# Patient Record
Sex: Male | Born: 1955 | Race: White | Hispanic: No | Marital: Married | Smoking: Never smoker
Health system: Southern US, Community
[De-identification: ages and names within clinical notes are randomized; demographics above are authoritative.]

## PROBLEM LIST (undated history)

## (undated) DIAGNOSIS — I7771 Dissection of carotid artery: Secondary | ICD-10-CM

## (undated) DIAGNOSIS — R251 Tremor, unspecified: Secondary | ICD-10-CM

## (undated) DIAGNOSIS — K635 Polyp of colon: Secondary | ICD-10-CM

## (undated) DIAGNOSIS — E785 Hyperlipidemia, unspecified: Principal | ICD-10-CM

## (undated) DIAGNOSIS — G4700 Insomnia, unspecified: Secondary | ICD-10-CM

## (undated) HISTORY — DX: Dissection of carotid artery: I77.71

## (undated) HISTORY — DX: Polyp of colon: K63.5

## (undated) HISTORY — DX: Tremor, unspecified: R25.1

## (undated) HISTORY — PX: HERNIA REPAIR: SHX51

## (undated) HISTORY — DX: Insomnia, unspecified: G47.00

## (undated) HISTORY — PX: COLONOSCOPY: SHX174

## (undated) HISTORY — DX: Hyperlipidemia, unspecified: E78.5

---

## 2000-11-26 ENCOUNTER — Encounter (INDEPENDENT_AMBULATORY_CARE_PROVIDER_SITE_OTHER): Payer: Self-pay | Admitting: Specialist

## 2000-11-26 ENCOUNTER — Other Ambulatory Visit: Admission: RE | Admit: 2000-11-26 | Discharge: 2000-11-26 | Payer: Self-pay | Admitting: Gastroenterology

## 2008-04-22 ENCOUNTER — Ambulatory Visit (HOSPITAL_BASED_OUTPATIENT_CLINIC_OR_DEPARTMENT_OTHER): Admission: RE | Admit: 2008-04-22 | Discharge: 2008-04-22 | Payer: Self-pay | Admitting: Family Medicine

## 2008-11-15 ENCOUNTER — Encounter (INDEPENDENT_AMBULATORY_CARE_PROVIDER_SITE_OTHER): Payer: Self-pay | Admitting: *Deleted

## 2009-08-17 ENCOUNTER — Telehealth: Payer: Self-pay | Admitting: Gastroenterology

## 2010-08-12 ENCOUNTER — Encounter: Payer: Self-pay | Admitting: Family Medicine

## 2010-08-21 NOTE — Progress Notes (Signed)
Summary: Schedule Colonoscopy  Phone Note Outgoing Call Call back at [facility] Phone [phone]   Call placed by: Harlow Mares CMA Duncan Dull),  August 17, 2009 11:12 AM Call placed to: Patient Summary of Call: patients home number is disconnected. the alt contact is the wrong number.  Initial call taken by: Harlow Mares CMA (AAMA),  August 17, 2009 11:14 AM

## 2012-02-04 ENCOUNTER — Encounter: Payer: Self-pay | Admitting: Gastroenterology

## 2012-08-31 ENCOUNTER — Ambulatory Visit
Admission: RE | Admit: 2012-08-31 | Discharge: 2012-08-31 | Disposition: A | Discharge status: Home / Self Care | Payer: No Typology Code available for payment source | Source: Ambulatory Visit | Attending: Otolaryngology | Admitting: Otolaryngology

## 2012-08-31 ENCOUNTER — Other Ambulatory Visit: Payer: Self-pay | Admitting: Otolaryngology

## 2012-08-31 DIAGNOSIS — J019 Acute sinusitis, unspecified: Secondary | ICD-10-CM

## 2012-10-09 ENCOUNTER — Other Ambulatory Visit: Payer: Self-pay | Admitting: Family Medicine

## 2012-10-09 DIAGNOSIS — G44009 Cluster headache syndrome, unspecified, not intractable: Secondary | ICD-10-CM

## 2012-10-12 ENCOUNTER — Ambulatory Visit
Admission: RE | Admit: 2012-10-12 | Discharge: 2012-10-12 | Disposition: A | Discharge status: Home / Self Care | Payer: No Typology Code available for payment source | Source: Ambulatory Visit | Attending: Family Medicine | Admitting: Family Medicine

## 2012-10-12 DIAGNOSIS — G44009 Cluster headache syndrome, unspecified, not intractable: Secondary | ICD-10-CM

## 2012-10-23 ENCOUNTER — Encounter: Payer: Self-pay | Admitting: Neurology

## 2012-10-23 ENCOUNTER — Ambulatory Visit (INDEPENDENT_AMBULATORY_CARE_PROVIDER_SITE_OTHER): Payer: Self-pay | Admitting: Neurology

## 2012-10-23 VITALS — BP 125/79 | HR 73 | Ht 67.0 in | Wt 166.0 lb

## 2012-10-23 DIAGNOSIS — I7771 Dissection of carotid artery: Secondary | ICD-10-CM

## 2012-10-23 DIAGNOSIS — G4700 Insomnia, unspecified: Secondary | ICD-10-CM | POA: Insufficient documentation

## 2012-10-23 DIAGNOSIS — E785 Hyperlipidemia, unspecified: Secondary | ICD-10-CM | POA: Insufficient documentation

## 2012-10-23 HISTORY — DX: Dissection of carotid artery: I77.71

## 2012-10-23 MED ORDER — SUMATRIPTAN SUCCINATE 100 MG PO TABS: 100.0000 mg | ORAL_TABLET | ORAL | Status: DC | PRN

## 2012-10-23 MED ORDER — ASPIRIN EC 325 MG PO TBEC: 325.0000 mg | DELAYED_RELEASE_TABLET | Freq: Every day | ORAL | Status: DC

## 2012-10-23 MED ORDER — RIBOFLAVIN 100 MG PO TABS: 100.0000 mg | ORAL_TABLET | Freq: Two times a day (BID) | ORAL | Status: DC

## 2012-10-23 MED ORDER — ASPIRIN 81 MG PO TBEC: 325.0000 mg | DELAYED_RELEASE_TABLET | Freq: Every day | ORAL | Status: DC

## 2012-10-23 MED ORDER — OXYCODONE-ACETAMINOPHEN 10-325 MG PO TABS: 1.0000 | ORAL_TABLET | Freq: Three times a day (TID) | ORAL | Status: DC | PRN

## 2012-10-23 MED ORDER — MAGNESIUM OXIDE 400 MG PO TABS: 400.0000 mg | ORAL_TABLET | Freq: Two times a day (BID) | ORAL | Status: DC

## 2012-10-23 NOTE — Progress Notes (Signed)
[name] is a 57 years old right-handed Caucasian male, referred by his primary care physician Dr. 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  He was previously healthy, at the end of January 2014, he noticed nasal congestion, facial pressure, he presented to primary care for evaluation of possible sinus infection, had a CAT scan of the sinus, only showed left maxillary sinuses, he was getting antibiotic treatment, shortly afterwards, he developed violent vomiting, lasting for 24 hours, after that, he began to have worsening headaches, centered around his left eye, retro-orbital region constant severe pressure pain, eventually referred for MRA of the brain, I have reviewed the films together with his patients and his wife, George Perry at [facility], MR I. 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 There is true lumen narrowing of up to 60% in the visible upper neck, but symmetric appearing flow signal in the distal ICA and carotid termini.  Over the past few months, he continued to have almost constant left upper and retro-orbital area headaches, he was getting verapam as preventive medication, no significant side effect, no significant improvement either, sumatriptan as needed aborted his headache very effectively, but his headache coming back within 24 hours, he also has tried tapering dose of prednisone since end of March 2014, no significant side effect or benefit,  He noticed a left droopy eyelid, increased tearing from his left eye, no dysarthria, no visual difficulty, no motor deficit   Review of Systems  Out of a complete 14 system review, the patient complains of only the following symptoms, and all other reviewed systems are negative.   Constitutional:   N/A Cardiovascular:   N/A Ear/Nose/Throat:  N/A Skin: N/A Eyes: Left retro-orbital eye pain Respiratory: N/A Gastroitestinal: N/A    Hematology/Lymphatic:  N/A Endocrine:  N/A Musculoskeletal:N/A Allergy/Immunology: N/A Neurological: N/A Psychiatric:    N/A  PHYSICAL EXAMINATOINS:  Generalized: In no acute distress  Neck: Supple, no carotid bruits   Cardiac: Regular rate rhythm  Pulmonary: Clear to auscultation bilaterally  Musculoskeletal: No deformity  Neurological examination  Mentation: Alert oriented to time, place, history taking, and causual conversation  Cranial nerve II-XII: Left pupil was smaller than right side, difference was more pronounced in bright light, pupils were reactive, he has left ptosis, cover the upper edge of left pupil.  Extraocular movements were full, visual field were full on confrontational test. facial sensation and strength were normal. hearing was intact to finger rubbing bilaterally. Uvula tongue midline.  head turning and shoulder shrug and were normal and symmetric.Tongue protrusion into cheek strength was normal.  Motor: normal tone, bulk and strength.  Sensory: Intact to fine touch, pinprick, preserved vibratory sensation, and proprioception at toes.  Coordination: Normal finger to nose, heel-to-shin bilaterally there was no truncal ataxia  Gait: Rising up from seated position without assistance, normal stance, without trunk ataxia, moderate stride, good arm swing, smooth turning, able to perform tiptoe, and heel walking without difficulty.   Romberg signs: Negative  Deep tendon reflexes: Brachioradialis 2/2, biceps 2/2, triceps 2/2, patellar 2/2, Achilles 2/2, plantar responses were flexor bilaterally.   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  1. Continue Plavix 75 mg every day, add  on aspirin 81  mg a day, overlapping for 3 months 2. Will repeat MRA neck, and MRA of brain at that time, if there was no evidence of narrowing, I will keep him on Plavix 75 mg only. 3.  For his headaches, it is related to his left internal carotid artery dissection, responding well to Imitrex as needed, limited use to 2-3 times each week, I also giving him Percocet as needed, magnesium oxide, riboflavin twice a day as preventative medication 4. continue verapmil as preventative medication 5. RTC in one month, lab result from pcp, need b12, homocystine level

## 2012-11-23 ENCOUNTER — Encounter: Payer: Self-pay | Admitting: Neurology

## 2012-11-23 ENCOUNTER — Ambulatory Visit (INDEPENDENT_AMBULATORY_CARE_PROVIDER_SITE_OTHER): Payer: Self-pay | Admitting: Neurology

## 2012-11-23 VITALS — BP 141/85 | HR 83 | Ht 67.5 in | Wt 174.0 lb

## 2012-11-23 DIAGNOSIS — I7771 Dissection of carotid artery: Secondary | ICD-10-CM

## 2012-11-23 NOTE — Progress Notes (Signed)
George Perry is a 57 years old right-handed Caucasian male, referred by his primary care physician Dr. 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  He was previously healthy, at the end of January 2014, he noticed nasal congestion, facial pressure, he presented to primary care for evaluation of possible sinus infection, had a CAT scan of the sinus, only showed left maxillary sinuses, he was getting antibiotic treatment, shortly afterwards, he developed violent vomiting, lasting for 24 hours, after that, he began to have worsening headaches, centered around his left eye, retro-orbital region constant severe pressure pain, eventually referred for MRA of the brain, I have reviewed the films together with his patients and his wife, George Perry at [facility], MR I. 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 There is true lumen narrowing of up to 60% in the visible upper neck, but symmetric appearing flow signal in the distal ICA and carotid termini.  Over the past few months, he continued to have almost constant left upper and retro-orbital area headaches, he was getting verapam as preventive medication, no significant side effect, no significant improvement either, sumatriptan as needed aborted his headache very effectively, but his headache coming back within 24 hours, he also has tried tapering dose of prednisone since end of March 2014, no significant side effect or benefit,  He noticed a left droopy eyelid, increased tearing from his left eye, no dysarthria, no visual difficulty, no motor deficit  UPDATE May 5th 2014  He is doing very well clinically, his headache has much improved, his headache centered around his left retro-orbital region, Imitrex has been very helpful, he denies focal signs,    Review of Systems  Out of a complete 14 system review, the patient complains of only the following symptoms, and all other reviewed systems are negative.   Constitutional:   N/A Cardiovascular:  N/A Ear/Nose/Throat:  N/A Skin: N/A Eyes: Left retro-orbital eye pain Respiratory: N/A Gastroitestinal: N/A    Hematology/Lymphatic:  N/A Endocrine:  N/A Musculoskeletal:N/A Allergy/Immunology: N/A Neurological: N/A Psychiatric:    N/A  PHYSICAL EXAMINATOINS:  Generalized: In no acute distress  Neck: Supple, no carotid bruits   Cardiac: Regular rate rhythm  Pulmonary: Clear to auscultation bilaterally  Musculoskeletal: No deformity  Neurological examination  Mentation: Alert oriented to time, place, history taking, and causual conversation  Cranial nerve II-XII: Left pupil was 1 mm smaller than right side, difference was more pronounced in bright light, pupils were reactive, he has left ptosis, cover the upper edge of left pupil.  Extraocular movements were full, visual field were full on confrontational test. facial sensation and strength were normal. hearing was intact to finger rubbing bilaterally. Uvula tongue midline.  head turning and shoulder shrug and were normal and symmetric.Tongue protrusion into cheek strength was normal.  Motor: normal tone, bulk and strength.  Sensory: Intact to fine touch, pinprick, preserved vibratory sensation, and proprioception at toes.  Coordination: Normal finger to nose, heel-to-shin bilaterally there was no truncal ataxia  Gait: Rising up from seated position without assistance, normal stance, without trunk ataxia, moderate stride, good arm swing, smooth turning, able to perform tiptoe, and heel walking without difficulty.   Romberg signs: Negative  Deep tendon reflexes: Brachioradialis 2/2, biceps 2/2, triceps 2/2, patellar 2/2, Achilles 2/2, plantar responses were flexor bilaterally.   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  1. Continue Plavix 75 mg every day, add on aspirin 81 mg a day, overlapping for 3 months 2. Repeat MRA neck, and MRA of brain  In June 2014. 3. Add on folic acid, will also get lab result from his ENT, check b12, homocystein level.

## 2012-12-15 ENCOUNTER — Telehealth: Payer: Self-pay | Admitting: *Deleted

## 2012-12-23 ENCOUNTER — Other Ambulatory Visit: Payer: No Typology Code available for payment source

## 2013-01-01 ENCOUNTER — Ambulatory Visit
Admission: RE | Admit: 2013-01-01 | Discharge: 2013-01-01 | Disposition: A | Discharge status: Home / Self Care | Payer: No Typology Code available for payment source | Source: Ambulatory Visit | Attending: Neurology | Admitting: Neurology

## 2013-01-01 DIAGNOSIS — I7771 Dissection of carotid artery: Secondary | ICD-10-CM

## 2013-01-01 MED ORDER — GADOBENATE DIMEGLUMINE 529 MG/ML IV SOLN
15.0000 mL | Freq: Once | INTRAVENOUS | Status: AC | PRN
  Administered 2013-01-01: 15 mL via INTRAVENOUS

## 2013-01-06 ENCOUNTER — Telehealth: Payer: Self-pay | Admitting: Neurology

## 2013-01-06 NOTE — Telephone Encounter (Signed)
error 

## 2013-01-06 NOTE — Telephone Encounter (Signed)
Please call him MRA showed Slight asymmetry of the proximal internal carotid arteries at the skull base (diameters: right 5mm, left 4mm). Normal distal flow signal bilaterally. This may represent sequelae from prior known left ICA dissection. 2. Compared to prior MRA from 10/12/12, the left ICA dissection has improved. The previously visualized false lumen and narrowed true lumen have significantly improved  Give him a follow up appt in3-4 weeks.

## 2013-01-15 NOTE — Telephone Encounter (Signed)
Left message that MRA showed improvement of ICA dissection, per Dr. Terrace Arabia.  Further questions will be addressed on follow up.

## 2013-01-28 ENCOUNTER — Telehealth: Payer: Self-pay

## 2013-01-28 NOTE — Telephone Encounter (Signed)
Called and spoke with patient trying to schedule a follow up with Dr.Yan. Patient did not want to schedule at this time he will call back if he needs futher care.

## 2013-07-09 ENCOUNTER — Other Ambulatory Visit: Payer: Self-pay | Admitting: Neurology

## 2013-07-09 DIAGNOSIS — I7771 Dissection of carotid artery: Secondary | ICD-10-CM

## 2014-08-05 ENCOUNTER — Other Ambulatory Visit: Payer: Self-pay | Admitting: Neurology

## 2014-08-05 DIAGNOSIS — I7771 Dissection of carotid artery: Secondary | ICD-10-CM

## 2014-08-31 ENCOUNTER — Ambulatory Visit
Admission: RE | Admit: 2014-08-31 | Discharge: 2014-08-31 | Disposition: A | Discharge status: Home / Self Care | Payer: 59 | Source: Ambulatory Visit | Attending: Neurology | Admitting: Neurology

## 2014-08-31 ENCOUNTER — Ambulatory Visit
Admission: RE | Admit: 2014-08-31 | Discharge: 2014-08-31 | Disposition: A | Discharge status: Home / Self Care | Payer: Self-pay | Source: Ambulatory Visit | Attending: Neurology | Admitting: Neurology

## 2014-08-31 DIAGNOSIS — I7771 Dissection of carotid artery: Secondary | ICD-10-CM

## 2014-08-31 MED ORDER — GADOBENATE DIMEGLUMINE 529 MG/ML IV SOLN
15.0000 mL | Freq: Once | INTRAVENOUS | Status: AC | PRN
  Administered 2014-08-31: 15 mL via INTRAVENOUS

## 2014-09-09 ENCOUNTER — Ambulatory Visit (INDEPENDENT_AMBULATORY_CARE_PROVIDER_SITE_OTHER): Payer: Self-pay | Admitting: Surgery

## 2014-09-09 NOTE — H&P (Signed)
History of Present Illness George Perry. George Mcguffee MD; 09/09/2014 12:41 PM) Patient words: hernia.  The patient is a 59 year old male who presents with an inguinal hernia. Referred by Dr. Briscoe Deutscher for evaluation of bilateral inguinal hernias  This is a healthy 59 yo male who presents with 1-2 years of bilateral inguinal bulges. These have not enlarged significantly and remain reducible. No significant tenderness. No obstructive symptoms. The patient's wife has encouraged him to seek surgical attention for these. Dr. Maceo Pro has diagnosed him with inguinal hernia.  He has a history of spontaneous carotid dissection, but has been asymptomatic and off blood thinners for a couple of years. Past Surgical History (Ammie Eversole, LPN; 3/78/5885 02:77 AM) No pertinent past surgical history  Diagnostic Studies History (Ammie Eversole, LPN; 10/31/8784 76:72 AM) Colonoscopy 1-5 years ago  Allergies (Ammie Eversole, LPN; 0/94/7096 28:36 AM) Penicillins  Medication History (Ammie Eversole, LPN; 01/18/4764 46:50 AM) Aspirin EC (81MG  Tablet DR, Oral) Active. Folic Acid (1MG  Tablet, Oral) Active. Magnesium Oxide (400MG  Tablet, Oral) Active. Riboflavin (100MG  Capsule, Oral) Active.  Social History (Ammie Eversole, LPN; 3/54/6568 12:75 AM) Alcohol use Occasional alcohol use. Caffeine use Tea. No drug use Tobacco use Never smoker.  Family History (Ammie Eversole, LPN; 1/70/0174 94:49 AM) First Degree Relatives No pertinent family history     Review of Systems (Ammie Eversole LPN; 6/75/9163 84:66 AM) General Not Present- Appetite Loss, Chills, Fatigue, Fever, Night Sweats, Weight Gain and Weight Loss. Skin Not Present- Change in Wart/Mole, Dryness, Hives, Jaundice, New Lesions, Non-Healing Wounds, Rash and Ulcer. HEENT Not Present- Earache, Hearing Loss, Hoarseness, Nose Bleed, Oral Ulcers, Ringing in the Ears, Seasonal Allergies, Sinus Pain, Sore Throat, Visual Disturbances, Wears  glasses/contact lenses and Yellow Eyes. Respiratory Not Present- Bloody sputum, Chronic Cough, Difficulty Breathing, Snoring and Wheezing. Breast Not Present- Breast Mass, Breast Pain, Nipple Discharge and Skin Changes. Cardiovascular Not Present- Chest Pain, Difficulty Breathing Lying Down, Leg Cramps, Palpitations, Rapid Heart Rate, Shortness of Breath and Swelling of Extremities. Gastrointestinal Not Present- Abdominal Pain, Bloating, Bloody Stool, Change in Bowel Habits, Chronic diarrhea, Constipation, Difficulty Swallowing, Excessive gas, Gets full quickly at meals, Hemorrhoids, Indigestion, Nausea, Rectal Pain and Vomiting.  Vitals (Ammie Eversole LPN; 5/99/3570 17:79 AM) 09/09/2014 10:34 AM Weight: 167.4 lb Height: 69in Body Surface Area: 1.92 m Body Mass Index: 24.72 kg/m Temp.: 98.93F(Oral)  Pulse: 86 (Regular)  BP: 122/88 (Sitting, Left Arm, Standard)     Physical Exam Rodman Key K. Mayur Duman MD; 09/09/2014 12:41 PM)  The physical exam findings are as follows: Note:WDWN in NAD HEENT: EOMI, sclera anicteric Neck: No masses, no thyromegaly Lungs: CTA bilaterally; normal respiratory effort CV: Regular rate and rhythm; no murmurs Abd: +bowel sounds, soft, non-tender, no masses GU: bilateral inguinal bulges - reducible; no testicular masses Ext: Well-perfused; no edema Skin: Warm, dry; no sign of jaundice    Assessment & Plan Rodman Key K. Joselinne Lawal MD; 09/09/2014 12:42 PM)  BILATERAL INGUINAL HERNIA WITHOUT OBSTRUCTION OR GANGRENE, RECURRENCE NOT SPECIFIED (550.92  K40.20)  Current Plans Schedule for Surgery - Bilateral inguinal hernia repairs with mesh. Please call us back to let us know if you prefer the open approach or the laparoscopic approach.  The surgical procedure has been discussed with the patient. Potential risks, benefits, alternative treatments, and expected outcomes have been explained. All of the patient's questions at this time have been answered. The  likelihood of reaching the patient's treatment goal is good. The patient understand the proposed surgical procedure and wishes to proceed.  George Perry. Georgette Dover, MD, [facility] Surgery  General/ Trauma Surgery  09/09/2014 12:42 PM

## 2014-09-29 ENCOUNTER — Encounter: Payer: Self-pay | Admitting: Sports Medicine

## 2014-09-29 ENCOUNTER — Ambulatory Visit (INDEPENDENT_AMBULATORY_CARE_PROVIDER_SITE_OTHER): Payer: 59 | Admitting: Sports Medicine

## 2014-09-29 VITALS — BP 118/93 | Ht 67.0 in | Wt 165.0 lb

## 2014-09-29 DIAGNOSIS — M79672 Pain in left foot: Secondary | ICD-10-CM

## 2014-09-29 DIAGNOSIS — M722 Plantar fascial fibromatosis: Secondary | ICD-10-CM

## 2014-09-29 NOTE — Progress Notes (Signed)
  George Perry - 59 y.o. male MRN [id number]  Date of birth: 04-24-1956  SUBJECTIVE: CC: Left heel pain, initial evaluation HPI:   4+ weeks of left heel pain that is worse after prolonged sitting or with first step in the morning.  Reports stepping on an object in August 2015 while at the beach and having one hour of pain but this resolved following that.  Walking with a limp due to the pain over the medial aspect of the calcaneus.  Does not radiate  No numbness, tingling.  No change in activity or exercise pattern.  Has been trying stretching and cushioned shoes with some improvement in symptoms but not significant. 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  ROS: per HPI  HISTORY:  Past Medical, Surgical, Social, and Family History reviewed & updated per EMR.  Pertinent Historical Findings include:  reports that he has never smoked. He has never used smokeless tobacco. Prior spontaneous carotid artery dissection, healed, otherwise relatively healthy.   OBJECTIVE:  VS:   HT:5\' 7"  (170.2 cm)   WT:165 lb (74.844 kg)  BMI:25.9          BP:(!) 118/93 mmHg  HR: bpm  TEMP: ( )  RESP:   PHYSICAL EXAM:  GENERAL: Adult Caucasian male. No acute distress PSYCH: Alert and appropriately interactive. SKIN: No open skin lesions or abnormal skin markings on areas inspected as below VASCULAR: DP and PT 2+/4, no pretibial edema NEURO: Lower extremity strength is 5+/5 in all myotomes; sensation is intact to light touch in all dermatomes. BILATERAL FEET: Cavus foot bilaterally. Right long arch slightly higher. Well-maintained with weightbearing. Does have a cannula valgus with weightbearing on the left. Good posterior tibialis recruitment. Splay toe between the first and second bilaterally with incurling of third fourth and fifth on right.  DATA OBTAINED DURING VISIT: LIMITED MSK ULTRASOUND OF BILATERAL FEET: Findings:  Left plantar fascia: Significant  with thickened, measures 0.74 cm with significant hypoechoic change., Evidence of thickening throughout the long arch at 0.4 cm, 4 cm from the insertion. Right plantar fascia moderate amount of thickening approximately 0.44 cm Impression: Left plantar fasciitis with chronic thickening of bilateral plantar fascia  & long arch  ASSESSMENT: 1. Plantar fasciitis, bilateral   2. Heel pain, left    PLAN: See problem based charting & AVS for additional documentation.  Green cushioned sports insoles with medial wedging for calcaneal valgus  Bilateral arch straps  Alfredson exercises  Otherwise per AVS > Return in about 2 months (around 11/29/2014) for repeat ultrasound.

## 2014-09-29 NOTE — Patient Instructions (Signed)
Bilateral arch straps Use the insoles  Stretches per handout  ICE in a bucket couple of times per day as tolerated for 10-15 minutes

## 2015-05-08 ENCOUNTER — Other Ambulatory Visit: Payer: Self-pay | Admitting: Neurology

## 2015-05-08 DIAGNOSIS — G4453 Primary thunderclap headache: Secondary | ICD-10-CM

## 2015-05-22 ENCOUNTER — Other Ambulatory Visit: Payer: 59

## 2015-05-22 ENCOUNTER — Ambulatory Visit
Admission: RE | Admit: 2015-05-22 | Discharge: 2015-05-22 | Disposition: A | Discharge status: Home / Self Care | Payer: 59 | Source: Ambulatory Visit | Attending: Neurology | Admitting: Neurology

## 2015-05-22 DIAGNOSIS — G4453 Primary thunderclap headache: Secondary | ICD-10-CM

## 2015-05-22 MED ORDER — GADOBENATE DIMEGLUMINE 529 MG/ML IV SOLN
15.0000 mL | Freq: Once | INTRAVENOUS | Status: AC | PRN
  Administered 2015-05-22: 15 mL via INTRAVENOUS

## 2015-11-17 ENCOUNTER — Ambulatory Visit: Payer: Self-pay | Admitting: Surgery

## 2015-11-17 NOTE — H&P (Signed)
  History of Present Illness Imogene Burn. Avni Traore MD; 11/17/2015 1:50 PM) Patient words: hernia.  The patient is a 60 year old male who presents with an inguinal hernia. Referred by Dr. Briscoe Deutscher for evaluation of bilateral inguinal hernias  This is a healthy 60 yo male who presents with 1-2 years of bilateral inguinal bulges. These have not enlarged significantly and remain reducible. No significant tenderness. No obstructive symptoms. The patient's wife has encouraged him to seek surgical attention for these. Dr. Maceo Pro has diagnosed him with inguinal hernia.  He has a history of spontaneous carotid dissection, but has been asymptomatic and off blood thinners for a couple of years.   Problem List/Past Medical Maia Petties, MD; 11/17/2015 1:50 PM) BILATERAL INGUINAL HERNIA WITHOUT OBSTRUCTION OR GANGRENE, RECURRENCE NOT SPECIFIED [id number])  Past Surgical History Maia Petties, MD; 11/17/2015 1:50 PM) No pertinent past surgical history  Diagnostic Studies History Maia Petties, MD; 11/17/2015 1:50 PM) Colonoscopy 1-5 years ago  Allergies Maia Petties, MD; 11/17/2015 1:50 PM) PenicillAMINE *ASSORTED CLASSES* Penicillins  Medication History (Sonya Bynum, CMA; 11/17/2015 10:05 AM) Aspirin EC (81MG  Tablet DR, Oral) Active. Folic Acid (1MG  Tablet, Oral) Active. Magnesium Oxide (400MG  Tablet, Oral) Active. Riboflavin (100MG  Capsule, Oral) Active. Medications Reconciled  Social History Maia Petties, MD; 11/17/2015 1:50 PM) Tobacco use Never smoker. No drug use Caffeine use Tea. Alcohol use Occasional alcohol use.  Family History Maia Petties, MD; 11/17/2015 1:50 PM) First Degree Relatives No pertinent family history    Vitals ([place]; 11/17/2015 10:02 AM) 11/17/2015 10:02 AM Weight: 168 lb Height: 67in Body Surface Area: 1.88 m Body Mass Index: 26.31 kg/m  Temp.: 30F(Temporal)  Pulse: 76 (Regular)  BP: 134/76 (Sitting,  Left Arm, Standard)      Physical Exam Rodman Key K. Kameshia Madruga MD; 11/17/2015 1:50 PM)  The physical exam findings are as follows: Note:WDWN in NAD HEENT: EOMI, sclera anicteric Neck: No masses, no thyromegaly Lungs: CTA bilaterally; normal respiratory effort CV: Regular rate and rhythm; no murmurs Abd: +bowel sounds, soft, non-tender, no masses GU: bilateral inguinal bulges - reducible; no testicular masses Ext: Well-perfused; no edema Skin: Warm, dry; no sign of jaundice    Assessment & Plan Rodman Key K. Dontavian Marchi MD; 11/17/2015 10:33 AM)  BILATERAL INGUINAL HERNIA WITHOUT OBSTRUCTION OR GANGRENE, RECURRENCE NOT SPECIFIED (K40.20)  Current Plans Schedule for Surgery - Open bilateral inguinal hernia repairs with mesh. The surgical procedure has been discussed with the patient. Potential risks, benefits, alternative treatments, and expected outcomes have been explained. All of the patient's questions at this time have been answered. The likelihood of reaching the patient's treatment goal is good. The patient understand the proposed surgical procedure and wishes to proceed.  Imogene Burn. Georgette Dover, MD, [facility] Surgery  General/ Trauma Surgery  11/17/2015 1:51 PM

## 2016-01-07 ENCOUNTER — Encounter (HOSPITAL_BASED_OUTPATIENT_CLINIC_OR_DEPARTMENT_OTHER): Payer: Self-pay | Admitting: *Deleted

## 2016-01-07 ENCOUNTER — Other Ambulatory Visit: Payer: Self-pay

## 2016-01-07 ENCOUNTER — Emergency Department (HOSPITAL_BASED_OUTPATIENT_CLINIC_OR_DEPARTMENT_OTHER)
Admission: EM | Admit: 2016-01-07 | Discharge: 2016-01-07 | Disposition: A | Discharge status: Home / Self Care | Payer: BLUE CROSS/BLUE SHIELD | Attending: Emergency Medicine | Admitting: Emergency Medicine

## 2016-01-07 DIAGNOSIS — Z7982 Long term (current) use of aspirin: Secondary | ICD-10-CM | POA: Insufficient documentation

## 2016-01-07 DIAGNOSIS — E785 Hyperlipidemia, unspecified: Secondary | ICD-10-CM | POA: Insufficient documentation

## 2016-01-07 DIAGNOSIS — R002 Palpitations: Secondary | ICD-10-CM | POA: Diagnosis not present

## 2016-01-07 DIAGNOSIS — R42 Dizziness and giddiness: Secondary | ICD-10-CM | POA: Insufficient documentation

## 2016-01-07 LAB — CBC WITH DIFFERENTIAL/PLATELET
BASOS PCT: 0 %
Basophils Absolute: 0 10*3/uL (ref 0.0–0.1)
EOS ABS: 0.1 10*3/uL (ref 0.0–0.7)
EOS PCT: 1 %
HCT: 46.8 % (ref 39.0–52.0)
HEMOGLOBIN: 16.7 g/dL (ref 13.0–17.0)
LYMPHS ABS: 1.1 10*3/uL (ref 0.7–4.0)
Lymphocytes Relative: 14 %
MCH: 33.3 pg (ref 26.0–34.0)
MCHC: 35.7 g/dL (ref 30.0–36.0)
MCV: 93.2 fL (ref 78.0–100.0)
MONO ABS: 0.6 10*3/uL (ref 0.1–1.0)
MONOS PCT: 8 %
NEUTROS PCT: 77 %
Neutro Abs: 6 10*3/uL (ref 1.7–7.7)
PLATELETS: 156 10*3/uL (ref 150–400)
RBC: 5.02 MIL/uL (ref 4.22–5.81)
RDW: 12.4 % (ref 11.5–15.5)
WBC: 7.8 10*3/uL (ref 4.0–10.5)

## 2016-01-07 LAB — BASIC METABOLIC PANEL
Anion gap: 8 (ref 5–15)
BUN: 14 mg/dL (ref 6–20)
CALCIUM: 9.4 mg/dL (ref 8.9–10.3)
CO2: 25 mmol/L (ref 22–32)
CREATININE: 1.03 mg/dL (ref 0.61–1.24)
Chloride: 105 mmol/L (ref 101–111)
GFR calc non Af Amer: >60 mL/min (ref >60)
Glucose, Bld: 100 mg/dL — ABNORMAL HIGH (ref 65–99)
Potassium: 4.5 mmol/L (ref 3.5–5.1)
SODIUM: 138 mmol/L (ref 135–145)

## 2016-01-07 MED ORDER — MECLIZINE HCL 25 MG PO TABS: 25.0000 mg | ORAL_TABLET | Freq: Three times a day (TID) | ORAL | Status: DC | PRN

## 2016-01-07 MED ORDER — MECLIZINE HCL 25 MG PO TABS
25.0000 mg | ORAL_TABLET | Freq: Once | ORAL | Status: AC
  Administered 2016-01-07: 25 mg via ORAL
  Filled 2016-01-07: qty 1

## 2016-01-07 NOTE — ED Provider Notes (Signed)
CSN: [id number]     Arrival date & time 01/07/16  1100 History   First MD Initiated Contact with Patient 01/07/16 1129     Chief Complaint  Patient presents with  . Dizziness     (Consider location/radiation/quality/duration/timing/severity/associated sxs/prior Treatment) HPI Comments: Patient presents with dizziness. He states it started yesterday when he woke up first thing in the morning. He describes the feeling of being on primary around. He states it's worse when he looks in certain directions or moves his head in certain positions. It's better when he is walking in a straight line with his eyes straight ahead. He denies any headache. He denies any speech deficits, no vision changes. No numbness or weakness to his extremities. He states initially got better yesterday but then started back again this morning when he woke up. He is not taking any medications. He states 2 days ago he did have an unusual episode of palpitations when he was petting the cat. He felt like his heart was racing. This lasted about 2-3 minutes and resolved. He was not lightheaded or dizzy when this happened. He had no chest pain. He's had no recurrence of symptoms. He does have a remote history of a carotid artery dissection in 2014. This was treated medically. This was detected when he had a severe headache and neck pain. He currently denies any headache or neck pain.  Patient is a 60 y.o. male presenting with dizziness.  Dizziness Associated symptoms: palpitations   Associated symptoms: no blood in stool, no chest pain, no diarrhea, no headaches, no nausea, no shortness of breath, no vomiting and no weakness     Past Medical History  Diagnosis Date  . Hyperlipemia   . Insomnia   . Carotid artery dissection ([place]) 10/23/2012   Past Surgical History  Procedure Laterality Date  . Colonoscopy    . Hernia repair     Family History  Problem Relation Age of Onset  . Coronary artery disease Father    Social  History  Substance Use Topics  . Smoking status: Never Smoker   . Smokeless tobacco: Never Used  . Alcohol Use: No    Review of Systems  Constitutional: Negative for fever, chills, diaphoresis and fatigue.  HENT: Negative for congestion, rhinorrhea and sneezing.   Eyes: Negative.   Respiratory: Negative for cough, chest tightness and shortness of breath.   Cardiovascular: Positive for palpitations. Negative for chest pain and leg swelling.  Gastrointestinal: Negative for nausea, vomiting, abdominal pain, diarrhea and blood in stool.  Genitourinary: Negative for frequency, hematuria, flank pain and difficulty urinating.  Musculoskeletal: Negative for back pain and arthralgias.  Skin: Negative for rash.  Neurological: Positive for dizziness. Negative for speech difficulty, weakness, numbness and headaches.      Allergies  Penicillins  Home Medications   Prior to Admission medications   Medication Sig Start Date End Date Taking? Authorizing Provider  aspirin-acetaminophen-caffeine (EXCEDRIN MIGRAINE) [phone] MG tablet Take 2 tablets by mouth every 6 (six) hours as needed for headache.   Yes Historical Provider, MD  aspirin EC 81 MG EC tablet Take 4 tablets (325 mg total) by mouth daily. 10/23/12   Marcial Pacas, MD   BP 153/96 mmHg  Pulse 95  Temp(Src) 98.3 F (36.8 C) (Oral)  Resp 16  Ht 5' 6.5" (1.689 m)  Wt 165 lb (74.844 kg)  BMI 26.24 kg/m2  SpO2 98% Physical Exam  Constitutional: He is oriented to person, place, and time. He appears well-developed and well-nourished.  HENT:  Head: Normocephalic and atraumatic.  Eyes: Pupils are equal, round, and reactive to light.  Subtle horizontal nystagmus, no vertical or rotational nystagmus  Neck: Normal range of motion. Neck supple.  Cardiovascular: Normal rate, regular rhythm and normal heart sounds.   Pulmonary/Chest: Effort normal and breath sounds normal. No respiratory distress. He has no wheezes. He has no rales. He  exhibits no tenderness.  Abdominal: Soft. Bowel sounds are normal. There is no tenderness. There is no rebound and no guarding.  Musculoskeletal: Normal range of motion. He exhibits no edema.  Lymphadenopathy:    He has no cervical adenopathy.  Neurological: He is alert and oriented to person, place, and time. He has normal strength. No cranial nerve deficit or sensory deficit. GCS eye subscore is 4. GCS verbal subscore is 5. GCS motor subscore is 6.  Finger-nose intact, no pronator drift  Skin: Skin is warm and dry. No rash noted.  Psychiatric: He has a normal mood and affect.    ED Course  Procedures (including critical care time) Labs Review Labs Reviewed  BASIC METABOLIC PANEL - Abnormal; Notable for the following:    Glucose, Bld 100 (*)    All other components within normal limits  CBC WITH DIFFERENTIAL/PLATELET    Imaging Review No results found. I have personally reviewed and evaluated these images and lab results as part of my medical decision-making.   EKG Interpretation   Date/Time:  Sunday January 07 2016 12:10:54 EDT Ventricular Rate:  85 PR Interval:    QRS Duration: 91 QT Interval:  333 QTC Calculation: 396 R Axis:   -19 Text Interpretation:  Sinus rhythm Borderline left axis deviation RSR' in  V1 or V2, probably normal variant since last tracing no significant change  Confirmed by Lonnette Shrode  MD, Heinz Eckert ([id number]) on 01/07/2016 12:16:43 PM      MDM   Final diagnoses:  Vertigo    Patient is able to ambulate without ataxia. He was given meclizine but he doesn't really note any difference. He actually denies any dizziness unless he bends over or looks down at the ground. There is no other associated symptoms that we more concerning for a cerebellar stroke. He doesn't have any other deficits or symptoms that would be more concerning for carotid artery dissection. He was discharged home in good condition. He was given prescription for meclizine. He was encouraged to  make a follow-up appointment with his PCP. Return precautions were given.    Malvin Johns, MD 01/07/16 1320

## 2016-01-07 NOTE — ED Notes (Signed)
amb to BR w/o difficulty 

## 2016-01-07 NOTE — ED Notes (Signed)
Pt c/o dizziness x 2 days

## 2016-01-07 NOTE — ED Notes (Signed)
MD at bedside. 

## 2016-01-07 NOTE — Discharge Instructions (Signed)

## 2016-02-12 ENCOUNTER — Encounter: Payer: Self-pay | Admitting: Internal Medicine

## 2016-03-26 ENCOUNTER — Ambulatory Visit (AMBULATORY_SURGERY_CENTER): Payer: Self-pay

## 2016-03-26 VITALS — Ht 67.5 in | Wt 171.2 lb

## 2016-03-26 DIAGNOSIS — Z86010 Personal history of colonic polyps: Secondary | ICD-10-CM

## 2016-03-26 MED ORDER — NA SULFATE-K SULFATE-MG SULF 17.5-3.13-1.6 GM/177ML PO SOLN: ORAL | 0 refills | Status: DC

## 2016-03-26 NOTE — Progress Notes (Signed)
Per pt, no allergies to soy or egg products.Pt not taking any weight loss meds or using  O2 at home. 

## 2016-03-27 ENCOUNTER — Encounter: Payer: Self-pay | Admitting: Internal Medicine

## 2016-04-09 ENCOUNTER — Encounter: Payer: BLUE CROSS/BLUE SHIELD | Admitting: Internal Medicine

## 2017-05-05 NOTE — Telephone Encounter (Signed)
Close Encounter 

## 2017-06-04 ENCOUNTER — Encounter: Payer: Self-pay | Admitting: Internal Medicine

## 2017-07-29 ENCOUNTER — Other Ambulatory Visit: Payer: Self-pay

## 2017-07-29 ENCOUNTER — Ambulatory Visit (AMBULATORY_SURGERY_CENTER): Payer: Self-pay | Admitting: *Deleted

## 2017-07-29 VITALS — Ht 67.5 in | Wt 175.0 lb

## 2017-07-29 DIAGNOSIS — Z1211 Encounter for screening for malignant neoplasm of colon: Secondary | ICD-10-CM

## 2017-07-29 MED ORDER — NA SULFATE-K SULFATE-MG SULF 17.5-3.13-1.6 GM/177ML PO SOLN: ORAL | 0 refills | Status: DC

## 2017-07-29 NOTE — Progress Notes (Signed)
Patient denies any allergies to eggs or soy. Patient denies any problems with anesthesia/sedation. Patient denies any oxygen use at home. Patient denies taking any diet/weight loss medications or blood thinners. EMMI education assisgned to patient on colonoscopy, this was explained and instructions given to patient. 

## 2017-08-05 ENCOUNTER — Encounter: Payer: Self-pay | Admitting: Internal Medicine

## 2017-08-07 ENCOUNTER — Telehealth: Payer: Self-pay | Admitting: Internal Medicine

## 2017-08-08 NOTE — Telephone Encounter (Signed)
Called pt- went to voice mail- George Perry

## 2017-08-08 NOTE — Telephone Encounter (Signed)
Pt states prep is 130.00- he wants coupon or different prep   Will call in suprep PNM $50 coupon- he's ok with this - left all coupon info on V[facility] as I called [facility] times and they never answered   BIN [id number] PCN [id number] Group- [id number] ID [id number]  Pt aware should drop to $50 with coupon   Lelan Pons PV

## 2017-08-12 ENCOUNTER — Ambulatory Visit (AMBULATORY_SURGERY_CENTER): Payer: BLUE CROSS/BLUE SHIELD | Admitting: Internal Medicine

## 2017-08-12 ENCOUNTER — Encounter: Payer: Self-pay | Admitting: Internal Medicine

## 2017-08-12 ENCOUNTER — Other Ambulatory Visit: Payer: Self-pay

## 2017-08-12 VITALS — BP 111/74 | HR 74 | Temp 98.7°F | Resp 14 | Ht 67.5 in | Wt 175.0 lb

## 2017-08-12 DIAGNOSIS — Z86010 Personal history of colonic polyps: Secondary | ICD-10-CM | POA: Diagnosis not present

## 2017-08-12 DIAGNOSIS — D123 Benign neoplasm of transverse colon: Secondary | ICD-10-CM | POA: Diagnosis not present

## 2017-08-12 DIAGNOSIS — K635 Polyp of colon: Secondary | ICD-10-CM | POA: Diagnosis not present

## 2017-08-12 DIAGNOSIS — D122 Benign neoplasm of ascending colon: Secondary | ICD-10-CM | POA: Diagnosis not present

## 2017-08-12 DIAGNOSIS — Z1211 Encounter for screening for malignant neoplasm of colon: Secondary | ICD-10-CM

## 2017-08-12 MED ORDER — SODIUM CHLORIDE 0.9 % IV SOLN: 500.0000 mL | Freq: Once | INTRAVENOUS | Status: DC

## 2017-08-12 NOTE — Progress Notes (Signed)
No problems noted in the recovery room. maw 

## 2017-08-12 NOTE — Progress Notes (Signed)
Report given to PACU, vss 

## 2017-08-12 NOTE — Progress Notes (Signed)
Called to room to assist during endoscopic procedure.  Patient ID and intended procedure confirmed with present staff. Received instructions for my participation in the procedure from the performing physician.  

## 2017-08-12 NOTE — Op Note (Signed)
[facility] Patient Name: George Perry Procedure Date: 08/12/2017 11:27 AM MRN: [id number] Endoscopist: Jerene Bears , MD Age: 62 Referring MD:  Date of Birth: 08/09/1955 Gender: Male Account #: [id number] Procedure:                Colonoscopy Indications:              High risk colon cancer surveillance: Personal                            history of colonic polyps (adenoma 2002), Last                            colonoscopy: 2005 Medicines:                Monitored Anesthesia Care Procedure:                Pre-Anesthesia Assessment:                           - Prior to the procedure, a History and Physical                            was performed, and patient medications and                            allergies were reviewed. The patient's tolerance of                            previous anesthesia was also reviewed. The risks                            and benefits of the procedure and the sedation                            options and risks were discussed with the patient.                            All questions were answered, and informed consent                            was obtained. Prior Anticoagulants: The patient has                            taken no previous anticoagulant or antiplatelet                            agents. ASA Grade Assessment: II - A patient with                            mild systemic disease. After reviewing the risks                            and benefits, the patient was deemed in  satisfactory condition to undergo the procedure.                           After obtaining informed consent, the colonoscope                            was passed under direct vision. Throughout the                            procedure, the patient's blood pressure, pulse, and                            oxygen saturations were monitored continuously. The                            Colonoscope was introduced through the anus and                             advanced to the the cecum, identified by                            appendiceal orifice and ileocecal valve. The                            colonoscopy was performed without difficulty. The                            patient tolerated the procedure well. The quality                            of the bowel preparation was good. The ileocecal                            valve, appendiceal orifice, and rectum were                            photographed. Scope In: 11:36:43 AM Scope Out: 11:54:20 AM Scope Withdrawal Time: 0 hours 12 minutes 21 seconds  Total Procedure Duration: 0 hours 17 minutes 37 seconds  Findings:                 The digital rectal exam was normal.                           A 5 mm polyp was found in the ascending colon. The                            polyp was sessile. The polyp was removed with a                            cold snare. Resection and retrieval were complete.                           A 6 mm polyp was found in the hepatic flexure. The  polyp was sessile. The polyp was removed with a                            cold snare. Resection and retrieval were complete.                           A 4 mm polyp was found in the transverse colon. The                            polyp was sessile. The polyp was removed with a                            cold snare. Resection and retrieval were complete.                           Internal hemorrhoids were found during                            retroflexion. The hemorrhoids were small. Complications:            No immediate complications. Estimated Blood Loss:     Estimated blood loss was minimal. Impression:               - One 5 mm polyp in the ascending colon, removed                            with a cold snare. Resected and retrieved.                           - One 6 mm polyp at the hepatic flexure, removed                            with a cold snare. Resected and  retrieved.                           - One 4 mm polyp in the transverse colon, removed                            with a cold snare. Resected and retrieved.                           - Small internal hemorrhoids. Recommendation:           - Patient has a contact number available for                            emergencies. The signs and symptoms of potential                            delayed complications were discussed with the                            patient. Return to normal activities tomorrow.  Written discharge instructions were provided to the                            patient.                           - Resume previous diet.                           - Continue present medications.                           - Await pathology results.                           - Repeat colonoscopy is recommended for                            surveillance. The colonoscopy date will be                            determined after pathology results from today's                            exam become available for review. Jerene Bears, MD 08/12/2017 11:58:02 AM This report has been signed electronically.

## 2017-08-12 NOTE — Progress Notes (Signed)
Pt's states no medical or surgical changes since previsit or office visit. maw 

## 2017-08-12 NOTE — Patient Instructions (Signed)
YOU HAD AN ENDOSCOPIC PROCEDURE TODAY AT THE [facility]:   Refer to the procedure report that was given to you for any specific questions about what was found during the examination.  If the procedure report does not answer your questions, please call your gastroenterologist to clarify.  If you requested that your care partner not be given the details of your procedure findings, then the procedure report has been included in a sealed envelope for you to review at your convenience later.  YOU SHOULD EXPECT: Some feelings of bloating in the abdomen. Passage of more gas than usual.  Walking can help get rid of the air that was put into your GI tract during the procedure and reduce the bloating. If you had a lower endoscopy (such as a colonoscopy or flexible sigmoidoscopy) you may notice spotting of blood in your stool or on the toilet paper. If you underwent a bowel prep for your procedure, you may not have a normal bowel movement for a few days.  Please Note:  You might notice some irritation and congestion in your nose or some drainage.  This is from the oxygen used during your procedure.  There is no need for concern and it should clear up in a day or so.  SYMPTOMS TO REPORT IMMEDIATELY:   Following lower endoscopy (colonoscopy or flexible sigmoidoscopy):  Excessive amounts of blood in the stool  Significant tenderness or worsening of abdominal pains  Swelling of the abdomen that is new, acute  Fever of 100F or higher   For urgent or emergent issues, a gastroenterologist can be reached at any hour by calling [phone].   DIET:  We do recommend a small meal at first, but then you may proceed to your regular diet.  Drink plenty of fluids but you should avoid alcoholic beverages for 24 hours.  ACTIVITY:  You should plan to take it easy for the rest of today and you should NOT DRIVE or use heavy machinery until tomorrow (because of the sedation medicines used during the test).     FOLLOW UP: Our staff will call the number listed on your records the next business day following your procedure to check on you and address any questions or concerns that you may have regarding the information given to you following your procedure. If we do not reach you, we will leave a message.  However, if you are feeling well and you are not experiencing any problems, there is no need to return our call.  We will assume that you have returned to your regular daily activities without incident.  If any biopsies were taken you will be contacted by phone or by letter within the next 1-3 weeks.  Please call us at [phone] if you have not heard about the biopsies in 3 weeks.    SIGNATURES/CONFIDENTIALITY: You and/or your care partner have signed paperwork which will be entered into your electronic medical record.  These signatures attest to the fact that that the information above on your After Visit Summary has been reviewed and is understood.  Full responsibility of the confidentiality of this discharge information lies with you and/or your care-partner.    Handouts were given to your care partner on polyps and hemorrhoids. You may resume your current medications today. Await biopsy results. Please call if any questions or concerns.   

## 2017-08-13 ENCOUNTER — Telehealth: Payer: Self-pay

## 2017-08-13 ENCOUNTER — Telehealth: Payer: Self-pay | Admitting: *Deleted

## 2017-08-13 NOTE — Telephone Encounter (Signed)
Number identifier, left a message. 

## 2017-08-13 NOTE — Telephone Encounter (Signed)
No answer for post procedure follow up call. Left message and will attempt to call back later this afternoon. SM

## 2017-08-15 ENCOUNTER — Encounter: Payer: Self-pay | Admitting: Internal Medicine

## 2017-10-18 ENCOUNTER — Encounter (HOSPITAL_COMMUNITY): Payer: Self-pay | Admitting: Emergency Medicine

## 2017-10-18 ENCOUNTER — Emergency Department (HOSPITAL_COMMUNITY)
Admission: EM | Admit: 2017-10-18 | Discharge: 2017-10-18 | Disposition: A | Discharge status: Home / Self Care | Payer: Self-pay | Attending: Emergency Medicine | Admitting: Emergency Medicine

## 2017-10-18 ENCOUNTER — Emergency Department (HOSPITAL_COMMUNITY): Payer: Self-pay

## 2017-10-18 DIAGNOSIS — R079 Chest pain, unspecified: Secondary | ICD-10-CM | POA: Insufficient documentation

## 2017-10-18 DIAGNOSIS — Z79899 Other long term (current) drug therapy: Secondary | ICD-10-CM | POA: Insufficient documentation

## 2017-10-18 DIAGNOSIS — R0789 Other chest pain: Secondary | ICD-10-CM

## 2017-10-18 LAB — BASIC METABOLIC PANEL
ANION GAP: 11 (ref 5–15)
BUN: 12 mg/dL (ref 6–20)
CO2: 23 mmol/L (ref 22–32)
Calcium: 9.4 mg/dL (ref 8.9–10.3)
Chloride: 104 mmol/L (ref 101–111)
Creatinine, Ser: 1.02 mg/dL (ref 0.61–1.24)
GFR calc Af Amer: >60 mL/min (ref >60)
GLUCOSE: 98 mg/dL (ref 65–99)
POTASSIUM: 4.1 mmol/L (ref 3.5–5.1)
Sodium: 138 mmol/L (ref 135–145)

## 2017-10-18 LAB — I-STAT TROPONIN, ED
Troponin i, poc: 0 ng/mL (ref 0.00–0.08)
Troponin i, poc: 0 ng/mL (ref 0.00–0.08)

## 2017-10-18 LAB — CBC
HEMATOCRIT: 47.4 % (ref 39.0–52.0)
HEMOGLOBIN: 16.4 g/dL (ref 13.0–17.0)
MCH: 33.7 pg (ref 26.0–34.0)
MCHC: 34.6 g/dL (ref 30.0–36.0)
MCV: 97.5 fL (ref 78.0–100.0)
Platelets: 161 10*3/uL (ref 150–400)
RBC: 4.86 MIL/uL (ref 4.22–5.81)
RDW: 12.4 % (ref 11.5–15.5)
WBC: 8.7 10*3/uL (ref 4.0–10.5)

## 2017-10-18 MED ORDER — METHOCARBAMOL 500 MG PO TABS: 500.0000 mg | ORAL_TABLET | Freq: Every evening | ORAL | 0 refills | Status: DC | PRN

## 2017-10-18 MED ORDER — NAPROXEN 500 MG PO TABS: 500.0000 mg | ORAL_TABLET | Freq: Two times a day (BID) | ORAL | 0 refills | Status: DC

## 2017-10-18 NOTE — ED Triage Notes (Signed)
Pt to ER for evaluation of central chest tightness onset 2 days ago, states also left shoulder/neck pain that he feels is separate from this chest pain. 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 Chest pain is not worsened by movement. Pt in NAD. Denies sob, nausea, emesis, weakness, etc.

## 2017-10-18 NOTE — ED Provider Notes (Signed)
[facility] EMERGENCY DEPARTMENT Provider Note   CSN: [id number] Arrival date & time: 10/18/17  1636     History   Chief Complaint Chief Complaint  Patient presents with  . Chest Pain    HPI George Perry is a 62 y.o. male with past medical history significant for carotid artery dissection, hyperlipidemia presenting with intermittent nonradiating chest tightness over the last 2 days.  Wife at bedside reporting that his blood pressure was more elevated when he came home from work than usual over the last few days and then for the past 2 days has been complaining of chest tightness.  Reports no exertional component or pleuritic component, no aggravating or alleviating factors.  Not tried anything for his symptoms.  He has been playing racquetball and running without any problems.  Patient has been following a healthy lifestyle, stays well-hydrated, does not smoke, exercises regularly and eats well.  He denies any shortness of breath, nausea, vomiting.  History of DVT/PE, history of malignancy, prolonged immobilization, recent travel, unilateral calf pain or swelling, no cough, hemoptysis, fever or chills. Family history of cardiac death in father at age 55.  Father also smoked, ate poorly was obese and did not exercise.   HPI  Past Medical History:  Diagnosis Date  . Carotid artery dissection ([place]) 10/23/2012  . Hyperlipemia   . Insomnia     Patient Active Problem List   Diagnosis Date Noted  . Carotid artery dissection ([place]) 10/23/2012  . Hyperlipemia   . Insomnia     Past Surgical History:  Procedure Laterality Date  . COLONOSCOPY  [id number]  . HERNIA REPAIR     Bil        Home Medications    Prior to Admission medications   Medication Sig Start Date End Date Taking? Authorizing Provider  Ascorbic Acid (VITAMIN C) 1000 MG tablet Take 1,000 mg by mouth daily.   Yes [provider]  b complex vitamins tablet Take 1 tablet by mouth  daily.   Yes [provider]  Calcium Carbonate-Vit D-Min (CALCIUM 1200 PO) Take by mouth as needed.   Yes [provider]  MAGNESIUM PO Take 1 tablet by mouth daily.   Yes [provider]  Multiple Vitamins-Minerals (ZINC PO) Take 1 tablet by mouth daily.   Yes [provider]  vitamin E 100 UNIT capsule Take 100 Units by mouth daily.   Yes [provider]  aspirin EC 81 MG EC tablet Take 4 tablets (325 mg total) by mouth daily. Patient not taking: Reported on 10/18/2017 10/23/12   Marcial Pacas, MD  methocarbamol (ROBAXIN) 500 MG tablet Take 1 tablet (500 mg total) by mouth at bedtime as needed. 10/18/17   Avie Echevaria B, PA-C  naproxen (NAPROSYN) 500 MG tablet Take 1 tablet (500 mg total) by mouth 2 (two) times daily. 10/18/17   Emeline General, PA-C    Family History Family History  Problem Relation Age of Onset  . Coronary artery disease Father   . Breast cancer Sister   . Tracheal cancer Brother   . Esophageal cancer Brother   . Colon cancer Neg Hx   . Liver disease Neg Hx   . Pancreatic cancer Neg Hx   . Prostate cancer Neg Hx   . Rectal cancer Neg Hx   . Stomach cancer Neg Hx     Social History Social History   Tobacco Use  . Smoking status: Never Smoker  . Smokeless tobacco: Never  Used  Substance Use Topics  . Alcohol use: Yes    Comment: wine occ. per pt  . Drug use: No     Allergies   Penicillins and Oxycodone   Review of Systems Review of Systems  Constitutional: Negative for chills, diaphoresis, fatigue and fever.  HENT: Negative for congestion.   Respiratory: Positive for chest tightness. Negative for cough, choking, shortness of breath, wheezing and stridor.   Cardiovascular: Negative for chest pain, palpitations and leg swelling.  Gastrointestinal: Negative for abdominal distention, abdominal pain, nausea and vomiting.  Genitourinary: Negative for dysuria.  Musculoskeletal: Negative for arthralgias and  back pain.  Skin: Negative for color change, pallor and rash.  Neurological: Negative for dizziness, seizures, syncope, weakness, light-headedness and headaches.     Physical Exam Updated Vital Signs BP 121/90   Pulse (!) 57   Temp 97.8 F (36.6 C) (Oral)   Resp 15   SpO2 94%   Physical Exam  Constitutional: He is oriented to person, place, and time. He appears well-developed and well-nourished.  Non-toxic appearance. He does not appear ill. No distress.  Well-appearing, nontoxic afebrile sitting comfortably in bed no acute distress.  HENT:  Head: Normocephalic and atraumatic.  Eyes: Conjunctivae are normal.  Neck: Neck supple.  Cardiovascular: Normal rate, regular rhythm and normal pulses.  No murmur heard. Pulmonary/Chest: Effort normal and breath sounds normal. No accessory muscle usage or stridor. No tachypnea. No respiratory distress. He has no decreased breath sounds. He has no wheezes. He has no rhonchi. He has no rales.  Abdominal: Soft. He exhibits no distension. There is no tenderness.  Musculoskeletal: Normal range of motion. He exhibits no edema.       Right lower leg: Normal. He exhibits no tenderness and no edema.       Left lower leg: Normal. He exhibits no tenderness and no edema.  Neurological: He is alert and oriented to person, place, and time.  Skin: Skin is warm and dry. No rash noted. He is not diaphoretic. No erythema. No pallor.  Psychiatric: He has a normal mood and affect.  Nursing note and vitals reviewed.    ED Treatments / Results  Labs (all labs ordered are listed, but only abnormal results are displayed) Labs Reviewed  BASIC METABOLIC PANEL  CBC  I-STAT TROPONIN, ED  I-STAT TROPONIN, ED    EKG EKG Interpretation  Date/Time:  Saturday October 18 2017 20:48:33 EDT Ventricular Rate:  69 PR Interval:    QRS Duration: 101 QT Interval:  366 QTC Calculation: 392 R Axis:   -19 Text Interpretation:  Sinus rhythm Ventricular premature complex  Prolonged PR interval Borderline left axis deviation RSR' in V1 or V2, probably normal variant Borderline T wave abnormalities No significant change since last tracing Confirmed by Duffy Bruce [phone]) on 10/18/2017 9:32:11 PM   Radiology Dg Chest 2 View  Result Date: 10/18/2017 CLINICAL DATA:  Chest pain EXAM: CHEST - 2 VIEW COMPARISON:  None. FINDINGS: The heart size and mediastinal contours are within normal limits. Both lungs are clear. The visualized skeletal structures are unremarkable. IMPRESSION: Clear lungs. Electronically Signed   By: Ulyses Jarred M.D.   On: 10/18/2017 17:38    Procedures Procedures (including critical care time)  Medications Ordered in ED Medications - No data to display   Initial Impression / Assessment and Plan / ED Course  I have reviewed the triage vital signs and the nursing notes.  Pertinent labs & imaging results that were available during my care  of the patient were reviewed by me and considered in my medical decision making (see chart for details).     Patient presenting with intermittent non-radiating chest tightness over the last 2 days.  No associated shortness of breath, nausea, vomiting.  Has been active and denies any symptoms with exertion. EKG unchanged from prior tracing Delta troponin negative Heart score : 2  Cannot use PERC to rule out PE due to age, but patient is low risk. He denies any pleuritic pain, SOB, HR 67, SPO2 97% on room air. Low suspicion for PE.  Labs, exam, and  general workup reassuring. Negative chest x-ray.  Patient is well-appearing, nontoxic.  I do not think that there is an emergent cause to patient's symptoms at this time.  Patient did not have any symptoms while in the emergency department.  Does report history of increased stress at work. Will discharge home with symptomatic relief and close follow-up with PCP.  Discussed strict return precautions and advised to return to the emergency department if  experiencing any new or worsening symptoms. Instructions were understood and patient agreed with discharge plan.  Final Clinical Impressions(s) / ED Diagnoses   Final diagnoses:  Chest tightness    ED Discharge Orders        Ordered    methocarbamol (ROBAXIN) 500 MG tablet  At bedtime PRN     10/18/17 2303    naproxen (NAPROSYN) 500 MG tablet  2 times daily     10/18/17 2303       Dossie Der 10/18/17 Charlton Haws    Duffy Bruce, MD 10/19/17 1121

## 2017-10-18 NOTE — Discharge Instructions (Signed)
As discussed, make sure that you stay well-hydrated and follow-up with your primary care provider.  Take Robaxin as needed for muscle spasm, do not drive or operate machinery while on this medication.  Naproxen twice daily with food. Return if symptoms worsen, shortness of breath, nausea vomiting, dizziness, or any other new concerning symptoms in the meantime.

## 2017-11-21 ENCOUNTER — Encounter (HOSPITAL_COMMUNITY): Payer: Self-pay

## 2018-07-08 ENCOUNTER — Ambulatory Visit (INDEPENDENT_AMBULATORY_CARE_PROVIDER_SITE_OTHER): Payer: PRIVATE HEALTH INSURANCE | Admitting: Family Medicine

## 2018-07-08 ENCOUNTER — Encounter: Payer: Self-pay | Admitting: Family Medicine

## 2018-07-08 VITALS — BP 118/84 | Ht 68.0 in | Wt 162.0 lb

## 2018-07-08 DIAGNOSIS — M25562 Pain in left knee: Secondary | ICD-10-CM

## 2018-07-08 NOTE — Patient Instructions (Signed)
You had a mild patellar subluxation that caused bruising behind the kneecap and pinching of the synovium. This is not dangerous but focus on the exercises I showed you. Straight leg raises, straight leg raises with foot turned outwards, hip side raises 3 sets of 10 once a day. Add ankle weight if these become too easy. Brace is unlikely to be helpful. Can take tylenol or aleve if needed but don't need to take this regularly. Icing if needed. In 3-4 weeks I would try squats, lunges, cycling and if not painful you can return to racquetball. Follow up with me in 5-6 weeks or as needed if you're doing well.

## 2018-07-09 ENCOUNTER — Encounter: Payer: Self-pay | Admitting: Family Medicine

## 2018-07-09 NOTE — Progress Notes (Signed)
PCP: Aura Dials, MD  Subjective:   HPI: Patient is a 62 y.o. male here for left knee pain.  Patient reports about 2 months ago when playing racquetball he went for a shot and felt a sharp pain under his left kneecap. Tried to keep playing but couldn't do so due to pain. Pain has persisted and is sharp at 2/10 level now. Worse with stairs - gets into a certain angle and pain is bad. Not tried medications recently - took occasional advil 3 weeks ago. Pain now behind and superior, inferior to patella. No skin changes, numbness.  Past Medical History:  Diagnosis Date  . Carotid artery dissection ([place]) 10/23/2012  . Hyperlipemia   . Insomnia     Current Outpatient Medications on File Prior to Visit  Medication Sig Dispense Refill  . Ascorbic Acid (VITAMIN C) 1000 MG tablet Take 1,000 mg by mouth daily.    Marland Kitchen aspirin EC 81 MG EC tablet Take 4 tablets (325 mg total) by mouth daily. (Patient not taking: Reported on 10/18/2017) 90 tablet 6  . b complex vitamins tablet Take 1 tablet by mouth daily.    . Calcium Carbonate-Vit D-Min (CALCIUM 1200 PO) Take by mouth as needed.    Marland Kitchen MAGNESIUM PO Take 1 tablet by mouth daily.    . methocarbamol (ROBAXIN) 500 MG tablet Take 1 tablet (500 mg total) by mouth at bedtime as needed. 20 tablet 0  . Multiple Vitamins-Minerals (ZINC PO) Take 1 tablet by mouth daily.    . naproxen (NAPROSYN) 500 MG tablet Take 1 tablet (500 mg total) by mouth 2 (two) times daily. 30 tablet 0  . vitamin E 100 UNIT capsule Take 100 Units by mouth daily.     Current Facility-Administered Medications on File Prior to Visit  Medication Dose Route Frequency Provider Last Rate Last Dose  . 0.9 %  sodium chloride infusion  500 mL Intravenous Once Pyrtle, Lajuan Lines, MD        Past Surgical History:  Procedure Laterality Date  . COLONOSCOPY  [id number]  . HERNIA REPAIR     Bil    Allergies  Allergen Reactions  . Penicillins Nausea And Vomiting  . Oxycodone Nausea And  Vomiting    Social History   Socioeconomic History  . Marital status: Married    Spouse name: Rose  . Number of children: 4  . Years of education: masters4  . Highest education level: Not on file  Occupational History    Employer: OTHER  Social Needs  . Financial resource strain: Not on file  . Food insecurity:    Worry: Not on file    Inability: Not on file  . Transportation needs:    Medical: Not on file    Non-medical: Not on file  Tobacco Use  . Smoking status: Never Smoker  . Smokeless tobacco: Never Used  Substance and Sexual Activity  . Alcohol use: Yes    Comment: wine occ. per pt  . Drug use: No  . Sexual activity: Not on file  Lifestyle  . Physical activity:    Days per week: Not on file    Minutes per session: Not on file  . Stress: Not on file  Relationships  . Social connections:    Talks on phone: Not on file    Gets together: Not on file    Attends religious service: Not on file    Active member of club or organization: Not on file    Attends meetings  of clubs or organizations: Not on file    Relationship status: Not on file  . Intimate partner violence:    Fear of current or ex partner: Not on file    Emotionally abused: Not on file    Physically abused: Not on file    Forced sexual activity: Not on file  Other Topics Concern  . Not on file  Social History Narrative   He works as Engineer, production, Network engineer job, lives with his family, 4 children    Family History  Problem Relation Age of Onset  . Coronary artery disease Father   . Breast cancer Sister   . Tracheal cancer Brother   . Esophageal cancer Brother   . Colon cancer Neg Hx   . Liver disease Neg Hx   . Pancreatic cancer Neg Hx   . Prostate cancer Neg Hx   . Rectal cancer Neg Hx   . Stomach cancer Neg Hx     BP 118/84   Ht 5\' 8"  (1.727 m)   Wt 162 lb (73.5 kg)   BMI 24.63 kg/m   Review of Systems: See HPI above.     Objective:  Physical Exam:  Gen: NAD, comfortable in exam  room  Left knee: No gross deformity, ecchymoses, swelling. No TTP. FROM with 5/5 strength flexion and extension. Negative ant/post drawers. Negative valgus/varus testing. Negative lachmans. Negative mcmurrays, apleys, patellar apprehension. NV intact distally.  Right knee: No deformity. FROM with 5/5 strength. No tenderness to palpation. NVI distally.   Assessment & Plan:  1. Left knee pain - 2/2 mild patellar subluxation with contusion posterior patella.  Shown strengthening exercises to do daily.  Tylenol or aleve, icing if needed.  Trial cycling, squats, lunges in about 3-4 weeks and see if he can tolerate before returning to racquetball.  F/u in 5-6 weeks or prn.

## 2019-12-13 ENCOUNTER — Ambulatory Visit (INDEPENDENT_AMBULATORY_CARE_PROVIDER_SITE_OTHER): Payer: PRIVATE HEALTH INSURANCE | Admitting: Neurology

## 2019-12-13 ENCOUNTER — Encounter: Payer: Self-pay | Admitting: Neurology

## 2019-12-13 ENCOUNTER — Other Ambulatory Visit: Payer: Self-pay

## 2019-12-13 ENCOUNTER — Telehealth: Payer: Self-pay | Admitting: Neurology

## 2019-12-13 VITALS — BP 120/81 | HR 64 | Ht 68.0 in | Wt 173.5 lb

## 2019-12-13 DIAGNOSIS — M5412 Radiculopathy, cervical region: Secondary | ICD-10-CM | POA: Diagnosis not present

## 2019-12-13 DIAGNOSIS — R251 Tremor, unspecified: Secondary | ICD-10-CM

## 2019-12-13 NOTE — Progress Notes (Addendum)
PATIENT: George Perry DOB: August 11, 1955  Chief Complaint  Patient presents with  . Abnormal Finger Motion    He is here with his wife, George Perry. Reports an involuntary tremor in his right pinky finger.   Marland Kitchen PCP    Lorene Dy, MD     HISTORICAL  George Perry is a 64 year old male, seen in request by his primary care physician Dr. Lorene Dy, for evaluation of right hand tremor, he is accompanied by his wife George Perry at today's clinical visit on Dec 13, 2019.  I have reviewed and summarized the referring note from the referring physician.  He had a history of left carotid artery dissection in 2014, presented with cluster headache symptoms, 6 weeks history of left periorbital pain, ptosis, I personally reviewed MRI of brain, MRI of brain and neck, cervical left ICA dissection, extending to the level of vertical petrous intracranial ICA, there is mild luminal narrowing up to 60%  Repeat MRI of the brain, MRA of the brain and neck in 2016 was normal, he is no longer on antiplatelet agent  He also reported a history of neck pain, radiating pain to right upper extremity involving right fourth and fifth fingers, in 2019, received epidural injection, which has helped his symptoms,  He is left-hand dominant date, around 2020, he began to noticed right fourth and fifth finger tremor, it does not bother him, but bother his wife very much, who observe its presence almost constantly, patient denied weakness, denies sensory change, only has intermittent neck pain, no significant radiating pain, he denies bowel bladder incontinence, no gait abnormalities,  He denies loss sense of smell, no REM sleep disorder REVIEW OF SYSTEMS: Full 14 system review of systems performed and notable only for as above All other review of systems were negative.  ALLERGIES: Allergies  Allergen Reactions  . Penicillins Nausea And Vomiting  . Oxycodone Nausea And Vomiting    HOME MEDICATIONS: Current  Outpatient Medications  Medication Sig Dispense Refill  . Ascorbic Acid (VITAMIN C) 1000 MG tablet Take 1,000 mg by mouth daily.    Marland Kitchen b complex vitamins tablet Take 1 tablet by mouth daily.    . Calcium Carbonate-Vit D-Min (CALCIUM 1200 PO) Take by mouth as needed.    Marland Kitchen MAGNESIUM PO Take 1 tablet by mouth daily.    . Multiple Vitamins-Minerals (ZINC PO) Take 1 tablet by mouth daily.    . vitamin E 100 UNIT capsule Take 100 Units by mouth daily.     No current facility-administered medications for this visit.    PAST MEDICAL HISTORY: Past Medical History:  Diagnosis Date  . Carotid artery dissection ([place]) 10/23/2012  . Insomnia   . Tremor    right pinky    PAST SURGICAL HISTORY: Past Surgical History:  Procedure Laterality Date  . COLONOSCOPY  [id number]  . HERNIA REPAIR     Bil    FAMILY HISTORY: Family History  Problem Relation Age of Onset  . Healthy Mother   . Coronary artery disease Father   . Heart disease Father   . Breast cancer Sister   . Tracheal cancer Brother   . Esophageal cancer Brother   . Colon cancer Neg Hx   . Liver disease Neg Hx   . Pancreatic cancer Neg Hx   . Prostate cancer Neg Hx   . Rectal cancer Neg Hx   . Stomach cancer Neg Hx     SOCIAL HISTORY: Social History   Socioeconomic History  .  Marital status: Married    Spouse name: Rose  . Number of children: 4  . Years of education: masters4  . Highest education level: Not on file  Occupational History  . Occupation: Occupational hygienist: [employer]  Tobacco Use  . Smoking status: Never Smoker  . Smokeless tobacco: Never Used  Substance and Sexual Activity  . Alcohol use: Yes    Comment: occasional  . Drug use: No  . Sexual activity: Not on file  Other Topics Concern  . Not on file  Social History Narrative   Lives at home with wife.   Left-handed.   One cup caffeine daily.    Social Determinants of Health   Financial Resource Strain:   . Difficulty of Paying Living  Expenses:   Food Insecurity:   . Worried About Charity fundraiser in the Last Year:   . Arboriculturist in the Last Year:   Transportation Needs:   . Film/video editor (Medical):   Marland Kitchen Lack of Transportation (Non-Medical):   Physical Activity:   . Days of Exercise per Week:   . Minutes of Exercise per Session:   Stress:   . Feeling of Stress :   Social Connections:   . Frequency of Communication with Friends and Family:   . Frequency of Social Gatherings with Friends and Family:   . Attends Religious Services:   . Active Member of Clubs or Organizations:   . Attends Archivist Meetings:   Marland Kitchen Marital Status:   Intimate Partner Violence:   . Fear of Current or Ex-Partner:   . Emotionally Abused:   Marland Kitchen Physically Abused:   . Sexually Abused:      PHYSICAL EXAM   Vitals:   12/13/19 0731  BP: 120/81  Pulse: 64  Weight: 173 lb 8 oz (78.7 kg)  Height: 5\' 8"  (1.727 m)    Not recorded      Body mass index is 26.38 kg/m.  PHYSICAL EXAMNIATION:  Gen: NAD, conversant, well nourised, well groomed                     Cardiovascular: Regular rate rhythm, no peripheral edema, warm, nontender. Eyes: Conjunctivae clear without exudates or hemorrhage Neck: Supple, no carotid bruits. Pulmonary: Clear to auscultation bilaterally   NEUROLOGICAL EXAM:  MENTAL STATUS: Speech:    Speech is normal; fluent and spontaneous with normal comprehension.  Cognition:     Orientation to time, place and person     Normal recent and remote memory     Normal Attention span and concentration     Normal Language, naming, repeating,spontaneous speech     Fund of knowledge   CRANIAL NERVES: CN II: Visual fields are full to confrontation. Pupils are round equal and briskly reactive to light. CN III, IV, VI: extraocular movement are normal. No ptosis. CN V: Facial sensation is intact to light touch CN VII: Face is symmetric with normal eye closure  CN VIII: Hearing is normal to  causal conversation. CN IX, X: Phonation is normal. CN XI: Head turning and shoulder shrug are intact  MOTOR: He is right non-dominant hand is mildly smaller than his dominant left hand, he has mild left right shoulder external rotation weakness, mild right finger abduction weakness  REFLEXES: Reflexes are 2+ and symmetric at the biceps, triceps, knees, and ankles. Plantar responses are flexor.  SENSORY: Intact to light touch, pinprick and vibratory sensation are intact in fingers and  toes.  COORDINATION: There is no trunk or limb dysmetria noted.  GAIT/STANCE: Posture is normal. Gait is steady with normal steps, base, arm swing, and turning. Heel and toe walking are normal. Tandem gait is normal.  Romberg is absent.   DIAGNOSTIC DATA (LABS, IMAGING, TESTING) - I reviewed patient records, labs, notes, testing and imaging myself where available.   ASSESSMENT AND PLAN  George Perry is a 64 y.o. male   History of right cervical radiculopathy, continue has right neck pain, With a year history of constant right fourth, fifth finger tremor,  On examination, he has mild right shoulder external rotation, mild right finger abduction weakness.  After discussed with patient, he wants to hold off further evaluation at this point  Reported normal laboratory evaluation from his primary care physician, we will get record   Marcial Pacas, M.D. Ph.D.  [facility] [address] [place], [place] [address] Ph: [phone] Fax: [phone]  CC: Lorene Dy, MD  Addendum, I reviewed laboratory evaluation dated November 03, 2019, LDL 120, A1c was 5.0, normal TSH 1.67, normal CBC, hemoglobin was 17.3,

## 2019-12-13 NOTE — Telephone Encounter (Signed)
Please get laboratory evaluation from his primary care physician Dr. Lorene Dy

## 2021-08-31 ENCOUNTER — Other Ambulatory Visit: Payer: Self-pay | Admitting: Surgery

## 2021-08-31 DIAGNOSIS — R1031 Right lower quadrant pain: Secondary | ICD-10-CM

## 2021-09-20 ENCOUNTER — Ambulatory Visit
Admission: RE | Admit: 2021-09-20 | Discharge: 2021-09-20 | Disposition: A | Discharge status: Home / Self Care | Payer: Medicare Other | Source: Ambulatory Visit | Attending: Surgery | Admitting: Surgery

## 2021-09-20 ENCOUNTER — Other Ambulatory Visit: Payer: Self-pay

## 2021-09-20 ENCOUNTER — Inpatient Hospital Stay: Admission: RE | Admit: 2021-09-20 | Payer: PRIVATE HEALTH INSURANCE | Source: Ambulatory Visit

## 2021-09-20 DIAGNOSIS — R1031 Right lower quadrant pain: Secondary | ICD-10-CM

## 2021-09-20 MED ORDER — IOPAMIDOL (ISOVUE-300) INJECTION 61%
100.0000 mL | Freq: Once | INTRAVENOUS | Status: AC | PRN
  Administered 2021-09-20: 100 mL via INTRAVENOUS

## 2021-09-24 ENCOUNTER — Encounter: Payer: Self-pay | Admitting: Gastroenterology

## 2021-09-24 NOTE — Progress Notes (Signed)
Please call the patient and let them know that their CT scan appeared normal, except for significant constipation.  Would recommend MIralax PRN for constipation.  No issues with his hernia surgeries.  F/U PRN ?

## 2021-10-03 ENCOUNTER — Encounter: Payer: Self-pay | Admitting: Gastroenterology

## 2021-10-03 ENCOUNTER — Ambulatory Visit: Payer: Medicare Other | Admitting: Gastroenterology

## 2021-10-03 VITALS — BP 120/90 | HR 80 | Ht 67.0 in | Wt 171.0 lb

## 2021-10-03 DIAGNOSIS — R1030 Lower abdominal pain, unspecified: Secondary | ICD-10-CM | POA: Insufficient documentation

## 2021-10-03 DIAGNOSIS — K5900 Constipation, unspecified: Secondary | ICD-10-CM | POA: Diagnosis not present

## 2021-10-03 NOTE — Progress Notes (Signed)
? ? ? ?10/03/2021 ?George Perry ?[id number] ?[birth year] ? ? ?HISTORY OF PRESENT ILLNESS: This is a 66 year old male who is a patient of Dr. Vena Rua.  He has been referred here on this occasion by Dr. Georgette Dover, for evaluation of lower abdominal pain.  He tells me that is been going on for the past couple of months.  He says that it is in his low abdomen in the area where he had a double hernia repair so he thought it was possibly related to that.  He saw Dr. Georgette Dover and he did not think so but ordered a CT scan.  The CT scan showed moderate to large amount of stool throughout the colon.  He says that his bowel movements have been less regular and more difficult recently.  He says that he used to go pretty regularly, but over the past several weeks he has been going 2 or 3 days at a time without a bowel movement.  No rectal bleeding. ? ?CT scan of the abdomen and pelvis with contrast on 09/22/21: ? ?IMPRESSION: ?1. Mild low-density gastric wall thickening which may reflect ?underdistention or gastritis. ?2. Moderate to large volume of formed stool throughout the colon ?suggesting constipation. ?3. Enlarged prostate gland. ?4.  Aortic Atherosclerosis (ICD10-I70.0). ? ?Colonoscopy January 2019 at which time he had 3 polyps, a 4 mm, a 5 mm, and a 6 mm, all removed as well as internal hemorrhoids.  2 polyps were tubular adenomas and the other was hyperplastic polyp.  Repeat recommended in 5 years. ?  ? ?Past Medical History:  ?Diagnosis Date  ? 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 Colon polyp   ? Insomnia   ? Tremor   ? right pinky  ? ?Past Surgical History:  ?Procedure Laterality Date  ? COLONOSCOPY  [id number]  ? HERNIA REPAIR    ? Bil  ? ? reports that he has never smoked. He has never used smokeless tobacco. He reports current alcohol use. He reports that he does not use drugs. ?family history includes Breast cancer in his sister; Coronary artery disease in his father; Esophageal cancer in his brother; Healthy  in his mother; Heart disease in his father; Tracheal cancer in his brother. ?Allergies  ?Allergen Reactions  ? Penicillins Nausea And Vomiting  ? Oxycodone Nausea And Vomiting  ? ? ?  ?Outpatient Encounter Medications as of 10/03/2021  ?Medication Sig  ? Ascorbic Acid (VITAMIN C) 1000 MG tablet Take 1,000 mg by mouth daily.  ? b complex vitamins tablet Take 1 tablet by mouth daily.  ? Calcium Carbonate-Vit D-Min (CALCIUM 1200 PO) Take by mouth as needed.  ? MAGNESIUM PO Take 1 tablet by mouth daily.  ? Multiple Vitamins-Minerals (ZINC PO) Take 1 tablet by mouth daily.  ? vitamin E 100 UNIT capsule Take 100 Units by mouth daily.  ? ?No facility-administered encounter medications on file as of 10/03/2021.  ? ? ? ?REVIEW OF SYSTEMS  : All other systems reviewed and negative except where noted in the History of Present Illness. ? ? ?PHYSICAL EXAM: ?BP 120/90   Pulse 80   Ht '5\' 7"'$  (1.702 m)   Wt 171 lb (77.6 kg)   BMI 26.78 kg/m?  ?General: Well developed white male in no acute distress ?Head: Normocephalic and atraumatic ?Eyes:  Sclerae anicteric, conjunctiva pink. ?Ears: Normal auditory acuity ?Lungs: Clear throughout to auscultation; no W/R/R. ?Heart: Regular rate and rhythm; no M/R/G. ?Abdomen: Soft, non-distended.  BS present.  Non-tender. ?Musculoskeletal: Symmetrical with no  gross deformities  ?Skin: No lesions on visible extremities ?Extremities: No edema  ?Neurological: Alert oriented x 4, grossly non-focal ?Psychological:  Alert and cooperative. Normal mood and affect ? ?ASSESSMENT AND PLAN: ?*Lower abdominal pain: CT scan showed moderate to large amount of stool throughout the colon.  He thought the pain was related to the mesh and his hernias, but Dr. Georgette Dover does not think so.  Constipation could certainly be causing the pressure discomfort.  I am going to have him do a MiraLAX bowel purge this evening to see if just getting him cleaned out gets things back on track.  He also has been using psyllium husk,  which may be causing him too much bulk.  Advised him to discontinue that.  If he he does not have a bowel movement then again after the MiraLAX purge for couple of days, I asked him to begin taking the MiraLAX on a daily basis.  He will message [place] back in a few weeks with an update on his symptoms.  He is also due to have blood work performed with his PCP next month so I suggested that maybe he ask about having a TSH drawn as well due to the recent change in bowel habits. ? ? ?CC:  Lorene Dy, MD ? ?  ?

## 2021-10-03 NOTE — Patient Instructions (Signed)
If you are age 67 or older, your body mass index should be between 23-30. Your Body mass index is 26.78 kg/m?Marland Kitchen If this is out of the aforementioned range listed, please consider follow up with your Primary Care Provider. ? ?If you are age 72 or younger, your body mass index should be between 19-25. Your Body mass index is 26.78 kg/m?Marland Kitchen If this is out of the aformentioned range listed, please consider follow up with your Primary Care Provider.  ? ?Janett Billow recommends that you complete a bowel purge (to clean out your bowels). Please do the following: ?Purchase a bottle of Miralax over the counter as well as a box of 5 mg dulcolax tablets. ?Take 4 dulcolax tablets. ?Wait 1 hour. ?You will then drink 6-8 capfuls of Miralax mixed in an adequate amount of water/juice/gatorade (you may choose which of these liquids to drink) over the next 2-3 hours. ?You should expect results within 1 to 6 hours after completing the bowel purge.  ? ? ?Stop Psyllium husk. ? ?Start Miralax daily of needed. ? ?Send a Pharmacist, community message with an update in three weeks. ? ?The [facility] providers would like to encourage you to use [facility] to communicate with providers for non-urgent requests or questions.  Due to long hold times on the telephone, sending your provider a message by [facility] may be a faster and more efficient way to get a response.  Please allow 48 business hours for a response.  Please remember that this is for non-urgent requests.  ?_______________________________________________________ ? ?

## 2021-10-04 NOTE — Progress Notes (Signed)
Addendum: Reviewed and agree with assessment and management plan. Sorrel Cassetta M, MD  

## 2021-10-25 ENCOUNTER — Ambulatory Visit: Payer: Medicare Other | Admitting: Sports Medicine

## 2021-10-25 DIAGNOSIS — M25551 Pain in right hip: Secondary | ICD-10-CM

## 2021-10-25 NOTE — Progress Notes (Signed)
Patient was instructed in 10 minutes of therapeutic exercises for right hip pain to improve strength, ROM and function according to my instructions and plan of care by a Certified Athletic Trainer during the office visit.  Proper technique shown and discussed, handout provided.  All questions discussed and answered.  ?

## 2021-10-25 NOTE — Assessment & Plan Note (Signed)
We will use the abduction series for HEP ?OK to start running after a couple weeks of rehab at 50% level and build ?Re ck 6 weeks and if still a problem with do an [place] ?Sports insole with scaphoid support heel wedge ?

## 2021-10-25 NOTE — Patient Instructions (Addendum)
It was great to see you today! ? ?For your right hip pain we discussed the following: ? ?-Please perform the exercises given to you today ?-You can return to running if it doesn't cause pain ?-Use the inserts anytime you are exercising or on your feet for a period of time. ?-After exercise or running you can use an ice cup massage over the painful area of your hip ?-Try Arnica gel over the area a couple times a days. You can buy this over the counter. ?-Please follow up in 6 weeks for re-evaluation ?

## 2021-10-25 NOTE — Progress Notes (Signed)
PCP: Lorene Dy, MD ? ?Subjective:  ? ?HPI: ?George Perry is a 66 y.o. male here for for right hip pain. ? ?Patient symptoms began approximately a month and a half ago.  He states that he did not notice a event that began the pain.  It has been slow and insidious in nature, and has begun to interfere with his running regimen of jogging and sprinting.  He typically runs 3 times a week for 45 minutes to an hour. Over the past several weeks he has not been trying to run that this aggravates his pain. He also notes that sleeping on the affected side and sitting in chairs with very little padding will exacerbate his right hip pain. He has been taking ibuprofen several times daily for his hip pain as well as for his his hernia strain. ? ?Patient states that the pain is located at the lateral aspect of his right hip and does not radiate. ? ?Past Medical History:  ?Diagnosis Date  ? 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 Colon polyp   ? Insomnia   ? Tremor   ? right pinky  ? ? ?Current Outpatient Medications on File Prior to Visit  ?Medication Sig Dispense Refill  ? Ascorbic Acid (VITAMIN C) 1000 MG tablet Take 1,000 mg by mouth daily.    ? b complex vitamins tablet Take 1 tablet by mouth daily.    ? Calcium Carbonate-Vit D-Min (CALCIUM 1200 PO) Take by mouth as needed.    ? MAGNESIUM PO Take 1 tablet by mouth daily.    ? Multiple Vitamins-Minerals (ZINC PO) Take 1 tablet by mouth daily.    ? vitamin E 100 UNIT capsule Take 100 Units by mouth daily.    ? ?No current facility-administered medications on file prior to visit.  ? ? ?Past Surgical History:  ?Procedure Laterality Date  ? COLONOSCOPY  [id number]  ? HERNIA REPAIR    ? Bil  ? ? ?Allergies  ?Allergen Reactions  ? Penicillins Nausea And Vomiting  ? Oxycodone Nausea And Vomiting  ? ? ?BP 130/88   Ht '5\' 8"'$  (1.727 m)   Wt 164 lb (74.4 kg)   BMI 24.94 kg/m?  ? ? ?  10/25/2021  ?  9:49 AM  ?[place] Adult Exercise  ?Frequency of aerobic exercise  (# of days/week) 3  ?Average time in minutes 35  ?Frequency of strengthening activities (# of days/week) 2  ? ? ?   ? View : No data to display.  ?  ?  ?  ? ? ?    ?Objective:  ? ?Physical Exam ?Constitutional:   ?   General: He is not in acute distress. ?   Appearance: Normal appearance.  ?Musculoskeletal:  ?   Right lower leg: No edema.  ?   Left lower leg: No edema.  ?   Comments: Hip Exam: ?- Inspection: No gross deformity, no swelling, erythema, or ecchymosis b/l ?- Palpation: No TTP, specifically none over greater trochanter b/l, tenderness to palpation over the gluteus medius and minimus of the right side ?- ROM: Normal range of motion on Flexion, extension, abduction, internal and external rotation b/l.   ?- Strength: 3/5 strength in the right hip abductors, otherwise strength fully intact bilaterally ?- Neuro/vasc: NV intact distally b/l ?- Special Tests: Negative FABER and FADIR b/l.  Negative log roll b/l  ?Neurological:  ?   Mental Status: He is alert.  ?Psychiatric:     ?   Mood and  Affect: Mood normal.     ?   Behavior: Behavior normal.  ? ?Normal hip rotation in all planes ?Running gait shows that he is shifting to RT with antalgic pushoff on RT ? ?  ?Assessment & Plan:  ?1.  Greater Trochanteric Pain Syndrome of the Right Hip: ? ? ? ?I observed and examined the patient with the resident and agree with assessment and plan.  Note reviewed and modified by me. ?Ila Mcgill, MD ?

## 2021-12-04 ENCOUNTER — Ambulatory Visit: Payer: Medicare Other | Admitting: Sports Medicine

## 2021-12-18 ENCOUNTER — Ambulatory Visit: Payer: Medicare Other | Admitting: Sports Medicine

## 2022-01-01 ENCOUNTER — Ambulatory Visit: Payer: Medicare Other | Admitting: Sports Medicine

## 2022-01-04 ENCOUNTER — Emergency Department (HOSPITAL_BASED_OUTPATIENT_CLINIC_OR_DEPARTMENT_OTHER)
Admission: EM | Admit: 2022-01-04 | Discharge: 2022-01-04 | Disposition: A | Discharge status: Home / Self Care | Payer: Medicare Other | Attending: Emergency Medicine | Admitting: Emergency Medicine

## 2022-01-04 ENCOUNTER — Emergency Department (HOSPITAL_BASED_OUTPATIENT_CLINIC_OR_DEPARTMENT_OTHER): Payer: Medicare Other

## 2022-01-04 ENCOUNTER — Encounter (HOSPITAL_BASED_OUTPATIENT_CLINIC_OR_DEPARTMENT_OTHER): Payer: Self-pay

## 2022-01-04 DIAGNOSIS — R000 Tachycardia, unspecified: Secondary | ICD-10-CM | POA: Diagnosis not present

## 2022-01-04 DIAGNOSIS — R072 Precordial pain: Secondary | ICD-10-CM | POA: Insufficient documentation

## 2022-01-04 DIAGNOSIS — R079 Chest pain, unspecified: Secondary | ICD-10-CM

## 2022-01-04 LAB — CBC WITH DIFFERENTIAL/PLATELET
Abs Immature Granulocytes: 0.04 10*3/uL (ref 0.00–0.07)
Basophils Absolute: 0 10*3/uL (ref 0.0–0.1)
Basophils Relative: 1 %
Eosinophils Absolute: 0.1 10*3/uL (ref 0.0–0.5)
Eosinophils Relative: 2 %
HCT: 46.7 % (ref 39.0–52.0)
Hemoglobin: 16.6 g/dL (ref 13.0–17.0)
Immature Granulocytes: 1 %
Lymphocytes Relative: 22 %
Lymphs Abs: 1.3 10*3/uL (ref 0.7–4.0)
MCH: 34.1 pg — ABNORMAL HIGH (ref 26.0–34.0)
MCHC: 35.5 g/dL (ref 30.0–36.0)
MCV: 95.9 fL (ref 80.0–100.0)
Monocytes Absolute: 0.5 10*3/uL (ref 0.1–1.0)
Monocytes Relative: 9 %
Neutro Abs: 4 10*3/uL (ref 1.7–7.7)
Neutrophils Relative %: 65 %
Platelets: 136 10*3/uL — ABNORMAL LOW (ref 150–400)
RBC: 4.87 MIL/uL (ref 4.22–5.81)
RDW: 12.1 % (ref 11.5–15.5)
WBC: 6 10*3/uL (ref 4.0–10.5)
nRBC: 0 % (ref 0.0–0.2)

## 2022-01-04 LAB — BASIC METABOLIC PANEL
Anion gap: 5 (ref 5–15)
BUN: 17 mg/dL (ref 8–23)
CO2: 25 mmol/L (ref 22–32)
Calcium: 9 mg/dL (ref 8.9–10.3)
Chloride: 107 mmol/L (ref 98–111)
Creatinine, Ser: 0.98 mg/dL (ref 0.61–1.24)
GFR, Estimated: >60 mL/min (ref >60)
Glucose, Bld: 99 mg/dL (ref 70–99)
Potassium: 4 mmol/L (ref 3.5–5.1)
Sodium: 137 mmol/L (ref 135–145)

## 2022-01-04 LAB — MAGNESIUM: Magnesium: 2.3 mg/dL (ref 1.7–2.4)

## 2022-01-04 LAB — TROPONIN I (HIGH SENSITIVITY)
Troponin I (High Sensitivity): 65 ng/L — ABNORMAL HIGH (ref <18)
Troponin I (High Sensitivity): 67 ng/L — ABNORMAL HIGH (ref <18)

## 2022-01-04 LAB — TSH: TSH: 1.787 u[IU]/mL (ref 0.350–4.500)

## 2022-01-04 NOTE — ED Notes (Signed)
Pt d/c home per MD order. Discharge summary reviewed with pt, pt verbalizes understanding. Ambulatory off unit. No s/s of acute distress noted at discharge.  °

## 2022-01-04 NOTE — ED Provider Notes (Signed)
[facility] Provider Note   CSN: [id number] Arrival date & time: 01/04/22  1044     History  Chief Complaint  Patient presents with   Chest Pain    George Perry is a 66 y.o. male.  He has no significant past medical history.  He said his Apple Watch alerted him to an elevated heart rate last night.  He noticed his heart was racing.  He was getting notifications of heart rates in the 130s and when he checked his blood pressure he found it to be in the 80s.  It was in the setting of having chest pain.  It seemed to last about 4 hours and self resolved slowly.  He has never had that before.  No recent illness.  Called his PCP office today who recommended he come here for evaluation.  No prior history of cardiac disease, runs 3 times a week.  Does have strong family history of heart in his father at similar age  The history is provided by the patient.  Chest Pain Pain location:  Substernal area Pain quality: aching   Pain severity:  Moderate Onset quality:  Sudden Duration:  4 hours Timing:  Constant Progression:  Resolved Chronicity:  New Context: at rest   Relieved by:  None tried Worsened by:  Nothing Ineffective treatments:  None tried Associated symptoms: no abdominal pain, no cough, no diaphoresis, no fever, no nausea, no shortness of breath and no vomiting   Risk factors: no coronary artery disease and no smoking        Home Medications Prior to Admission medications   Medication Sig Start Date End Date Taking? Authorizing Provider  Ascorbic Acid (VITAMIN C) 1000 MG tablet Take 1,000 mg by mouth daily.    [provider]  b complex vitamins tablet Take 1 tablet by mouth daily.    [provider]  Calcium Carbonate-Vit D-Min (CALCIUM 1200 PO) Take by mouth as needed.    [provider]  MAGNESIUM PO Take 1 tablet by mouth daily.    [provider]  Multiple Vitamins-Minerals (ZINC PO) Take 1 tablet  by mouth daily.    [provider]  vitamin E 100 UNIT capsule Take 100 Units by mouth daily.    [provider]      Allergies    Penicillins and Oxycodone    Review of Systems   Review of Systems  Constitutional:  Negative for diaphoresis and fever.  HENT:  Negative for sore throat.   Eyes:  Negative for visual disturbance.  Respiratory:  Negative for cough and shortness of breath.   Cardiovascular:  Positive for chest pain.  Gastrointestinal:  Negative for abdominal pain, nausea and vomiting.  Genitourinary:  Negative for dysuria.  Skin:  Negative for rash.    Physical Exam Updated Vital Signs BP (!) 128/101 (BP Location: Right Arm)   Pulse 63   Temp 98.1 F (36.7 C) (Oral)   Resp 16   Ht '5\' 8"'$  (1.727 m)   Wt 74.8 kg   SpO2 97%   BMI 25.09 kg/m  Physical Exam Vitals and nursing note reviewed.  Constitutional:      General: He is not in acute distress.    Appearance: He is well-developed.  HENT:     Head: Normocephalic and atraumatic.  Eyes:     Conjunctiva/sclera: Conjunctivae normal.  Cardiovascular:     Rate and Rhythm: Normal rate and regular rhythm.     Heart  sounds: Normal heart sounds. No murmur heard. Pulmonary:     Effort: Pulmonary effort is normal. No respiratory distress.     Breath sounds: Normal breath sounds.  Abdominal:     Palpations: Abdomen is soft.     Tenderness: There is no abdominal tenderness.  Musculoskeletal:        General: No swelling. Normal range of motion.     Cervical back: Neck supple.     Right lower leg: No tenderness.     Left lower leg: No tenderness.  Skin:    General: Skin is warm and dry.     Capillary Refill: Capillary refill takes less than 2 seconds.  Neurological:     General: No focal deficit present.     Mental Status: He is alert.     ED Results / Procedures / Treatments   Labs (all labs ordered are listed, but only abnormal results are displayed) Labs Reviewed  CBC WITH  DIFFERENTIAL/PLATELET - Abnormal; Notable for the following components:      Result Value   MCH 34.1 (*)    Platelets 136 (*)    All other components within normal limits  TROPONIN I (HIGH SENSITIVITY) - Abnormal; Notable for the following components:   Troponin I (High Sensitivity) 65 (*)    All other components within normal limits  TROPONIN I (HIGH SENSITIVITY) - Abnormal; Notable for the following components:   Troponin I (High Sensitivity) 67 (*)    All other components within normal limits  BASIC METABOLIC PANEL  MAGNESIUM  TSH    EKG EKG Interpretation  Date/Time:  Friday January 04 2022 10:53:26 EDT Ventricular Rate:  67 PR Interval:  214 QRS Duration: 92 QT Interval:  374 QTC Calculation: 395 R Axis:   -21 Text Interpretation: Sinus rhythm with 1st degree A-V block Incomplete right bundle branch block Borderline ECG When compared with ECG of 18-Oct-2017 20:48, No significant change since last tracing Confirmed by Aletta Edouard [phone]) on 01/04/2022 10:55:00 AM  Radiology DG Chest Port 1 View  Result Date: 01/04/2022 CLINICAL DATA:  cp EXAM: PORTABLE CHEST 1 VIEW COMPARISON:  October 18, 2017 FINDINGS: The heart size and mediastinal contours are within normal limits. Both lungs are clear. The visualized skeletal structures are unremarkable. IMPRESSION: No active disease. Electronically Signed   By: Frazier Richards M.D.   On: 01/04/2022 11:27    Procedures Procedures    Medications Ordered in ED Medications - No data to display  ED Course/ Medical Decision Making/ A&P Clinical Course as of 01/04/22 Vernelle Emerald  Fri Jan 04, 2022  1134 Chest x-ray interpreted by me as no acute infiltrates no pneumothorax.  Awaiting radiology reading. [MB]  1302 Discussed with cardiology Dr. Schuyler Amor.  She is recommending second troponin.  If rising call her back.  If stable or falling patient can follow-up with her outpatient next week or be admitted if he has concerns.  Reviewed with patient.   Awaiting second troponin. [MB]  3710 Second troponin not significantly changed.  Patient remains pain-free here.  Blood pressure and heart rate have been stable.  He was offered choice of admission to cardiology for further work-up or outpatient follow-up this week.  Patient would rather follow-up outpatient with cardiology.  Return instructions discussed. [MB]    Clinical Course User Index [MB] Hayden Rasmussen, MD                           Medical Decision  Making Amount and/or Complexity of Data Reviewed Labs: ordered. Radiology: ordered.   This patient complains of tachycardia chest pain hypotension; this involves an extensive number of treatment Options and is a complaint that carries with it a high risk of complications and morbidity. The differential includes arrhythmia, ACS, pneumonia, PE, pneumothorax vascular  I ordered, reviewed and interpreted labs, which included CBC with normal white count normal hemoglobin, chemistries normal, troponins elevated but flat, TSH normal  I ordered imaging studies which included chest x-ray and I independently    visualized and interpreted imaging which showed no acute findings Previous records obtained and reviewed in epic no recent admissions I consulted cardiology Dr. Margaretann Loveless and discussed lab and imaging findings and discussed disposition.  Cardiac monitoring reviewed, normal sinus rhythm Social determinants considered, no significant barriers Critical Interventions: None  After the interventions stated above, I reevaluated the patient and found patient remained pain-free here with no arrhythmias Admission and further testing considered,  Patient was offered admission to cardiology service which he declines.  He is comfortable plan for following up outpatient with cardiology.  We will put on aspirin daily and close follow-up with cardiology.  Return instructions discussed         Final Clinical Impression(s) / ED Diagnoses Final  diagnoses:  Nonspecific chest pain  Tachycardia    Rx / DC Orders ED Discharge Orders     None         Hayden Rasmussen, MD 01/04/22 1824

## 2022-01-04 NOTE — Discharge Instructions (Addendum)
You were seen in the emergency department for evaluation of an episode of chest pain and rapid heart rate, low blood pressure last evening.  Your EKG was reassuring although your cardiac enzymes were mildly elevated.  We discussed admission for the hospital versus close outpatient follow-up with cardiology and you elected to follow-up with cardiology.  An appointment has been set up on 6/22 at 11:30 AM.  If you experience further symptoms in the meantime please present to the emergency department.  I also think it be reasonable to take an 81 mg aspirin daily until followed up with cardiology.

## 2022-01-04 NOTE — ED Triage Notes (Signed)
States last night his HR was 130 and BP was 80/60. Has had intermittent chest pain since. VSS at triage.

## 2022-01-10 ENCOUNTER — Ambulatory Visit: Payer: Medicare Other | Admitting: Internal Medicine

## 2022-01-10 NOTE — Progress Notes (Incomplete)
Cardiology Office Note:    Date:  01/10/2022   ID:  George Perry, DOB [birth year], MRN [id number]  PCP:  Lorene Dy, MD  Cardiologist:  None  Electrophysiologist:  None   Referring MD: Lorene Dy, MD   Chief Complaint/Reason for Referral: Post hospital follow-up  History of Present Illness:    George Perry is a 66 y.o. male with a history of carotid artery dissection, here for post hospital follow-up of chest pain and tachycardia at the request of Dr. Melina Copa.  He presented to the ED 01/04/22 after his Apple Watch alerted him to heart rates in the 130's the night before, and he found his blood pressure to be in the 80's in the setting of having chest pain. This was a new symptom lasting 4 hours and resolving slowly on its own. His EKG showed Sinus rhythm at 67 bpm with 1st degree A-V block, Incomplete right bundle branch block. Troponins x2 were 65 and 67. He preferred to be discharged and follow-up with cardiology as an outpatient.  Today, ***  He denies any palpitations, chest pain, shortness of breath, or peripheral edema. No lightheadedness, headaches, syncope, orthopnea, or PND.   (+)   Past Medical History:  Diagnosis Date   Carotid artery dissection ([place]) 10/23/2012   Colon polyp    Insomnia    Tremor    right pinky    Past Surgical History:  Procedure Laterality Date   COLONOSCOPY  2002,2005   HERNIA REPAIR     Bil    Current Medications: No outpatient medications have been marked as taking for the 01/10/22 encounter (Appointment) with Elouise Munroe, MD.     Allergies:   Penicillins and Oxycodone   Social History   Tobacco Use   Smoking status: Never   Smokeless tobacco: Never  Vaping Use   Vaping Use: Never used  Substance Use Topics   Alcohol use: Yes    Comment: occasional   Drug use: No     Family History: The patient's family history includes Breast cancer in his sister; Coronary artery disease in his father;  Esophageal cancer in his brother; Healthy in his mother; Heart disease in his father; Tracheal cancer in his brother. There is no history of Colon cancer, Liver disease, Pancreatic cancer, Prostate cancer, Rectal cancer, or Stomach cancer.  ROS:   Please see the history of present illness.     All other systems reviewed and are negative.  EKGs/Labs/Other Studies Reviewed:    The following studies were reviewed today:  Chest X-Ray 01/04/2022: FINDINGS: The heart size and mediastinal contours are within normal limits. Both lungs are clear. The visualized skeletal structures are unremarkable.   IMPRESSION: No active disease.  CT Abdomen/Pelvis  09/20/2021: Vascular/Lymphatic: Aortic atherosclerosis without aneurysmal dilation. No pathologically enlarged abdominal or pelvic lymph nodes.  IMPRESSION: 1. Mild low-density gastric wall thickening which may reflect underdistention or gastritis. 2. Moderate to large volume of formed stool throughout the colon suggesting constipation. 3. Enlarged prostate gland. 4.  Aortic Atherosclerosis (ICD10-I70.0).  EKG:  EKG is personally reviewed. 01/10/2022: Sinus ***. Rate *** bpm.  Imaging studies that I have independently reviewed today: ***  Recent Labs: 01/04/2022: BUN 17; Creatinine, Ser 0.98; Hemoglobin 16.6; Magnesium 2.3; Platelets 136; Potassium 4.0; Sodium 137; TSH 1.787   Recent Lipid Panel No results found for: "CHOL", "TRIG", "HDL", "CHOLHDL", "VLDL", "LDLCALC", "LDLDIRECT"  Physical Exam:    VS:  There were no vitals taken for this visit.  Wt Readings from Last 5 Encounters:  01/04/22 165 lb (74.8 kg)  10/25/21 164 lb (74.4 kg)  10/03/21 171 lb (77.6 kg)  12/13/19 173 lb 8 oz (78.7 kg)  07/08/18 162 lb (73.5 kg)    Constitutional: No acute distress Eyes: sclera non-icteric, normal conjunctiva and lids ENMT: normal dentition, moist mucous membranes Cardiovascular: regular rhythm, normal rate, no murmur. S1 and S2  normal. No jugular venous distention.  Respiratory: clear to auscultation bilaterally GI : normal bowel sounds, soft and nontender. No distention.   MSK: extremities warm, well perfused. No edema.  NEURO: grossly nonfocal exam, moves all extremities. PSYCH: alert and oriented x 3, normal mood and affect.   ASSESSMENT:    No diagnosis found. PLAN:    No diagnosis found.  ***Plan: : -  Follow-up in *** months.   Total time of encounter: *** minutes total time of encounter, including *** minutes spent in face-to-face patient care on the date of this encounter. This time includes coordination of care and counseling regarding above mentioned problem list. Remainder of non-face-to-face time involved reviewing chart documents/testing relevant to the patient encounter and documentation in the medical record. I have independently reviewed documentation from referring provider.   Cherlynn Kaiser, MD, [place]   Shared Decision Making/Informed Consent:   {Are you ordering a CV Procedure (e.g. stress test, cath, DCCV, TEE, etc)?   Press F2        :[id number]}   Medication Adjustments/Labs and Tests Ordered: Current medicines are reviewed at length with the patient today.  Concerns regarding medicines are outlined above.   No orders of the defined types were placed in this encounter.  No orders of the defined types were placed in this encounter.  There are no Patient Instructions on file for this visit.   I,Mathew Stumpf,acting as a Education administrator for Elouise Munroe, MD.,have documented all relevant documentation on the behalf of Elouise Munroe, MD,as directed by  Elouise Munroe, MD while in the presence of Elouise Munroe, MD.  ***

## 2022-01-16 ENCOUNTER — Encounter: Payer: Self-pay | Admitting: Internal Medicine

## 2022-05-10 ENCOUNTER — Ambulatory Visit: Payer: Self-pay

## 2022-05-10 ENCOUNTER — Ambulatory Visit: Payer: Medicare Other | Admitting: Family Medicine

## 2022-05-10 VITALS — BP 141/96 | Ht 67.0 in | Wt 168.0 lb

## 2022-05-10 DIAGNOSIS — M7522 Bicipital tendinitis, left shoulder: Secondary | ICD-10-CM

## 2022-05-10 DIAGNOSIS — M25512 Pain in left shoulder: Secondary | ICD-10-CM

## 2022-05-10 MED ORDER — METHYLPREDNISOLONE ACETATE 40 MG/ML IJ SUSP
40.0000 mg | Freq: Once | INTRAMUSCULAR | Status: AC
  Administered 2022-05-10: 40 mg via INTRA_ARTICULAR

## 2022-05-12 ENCOUNTER — Encounter: Payer: Self-pay | Admitting: Family Medicine

## 2022-05-12 NOTE — Progress Notes (Signed)
  George Perry - 66 y.o. male MRN [id number]  Date of birth: 1956/06/05    SUBJECTIVE:      Chief Complaint:/ HPI:  Left upper extremity pain for 4 to 6 weeks.  Very active physically, works in [employer].  First noticed pain after he had been out filling some trees and lifting some logs but does not recall a specific issue.  Has tried over-the-counter topicals, NSAIDs, heat, ice, rest.  Nothing seems to make it much better.  It is nagging pain bothers him particularly at night.  He is right-hand dominant.    OBJECTIVE: BP (!) 141/96   Ht '5\' 7"'$  (1.702 m)   Wt 168 lb (76.2 kg)   BMI 26.31 kg/m   Physical Exam:  Vital signs are reviewed. GENERAL: Well-developed male no acute distress SHOULDERS: Symmetrical.  Rotator cuff muscles have 5 out of 5 intact strength in all planes bilaterally.  He has some mild pain with resisted forward flexion and abduction but does not seem to be specific to supraspinatus isolation.  Mild tenderness to palpation of the origin of the bicep tendon and deep palpation here reproduces his pain  ULTRASOUND and PROCEDURE: Left bicep tendon: Appears intact.  Seen in cross-section and longitudinal views.  There is significant amount of fluid within the tendon. Patient given informed consent, signed copy in the chart.  Area prepped and draped usual sterile fashion.  1 cc of lidocaine +1 cc of methylprednisolone 40 mg/mL was injected using ultrasound guidance into the tendon sheath of the left bicep tendon.  He experienced some pain during the injection but quickly resolved.  No bleeding.  5 minutes after the injection he had some relief of pain.  He continues to have full range of motion and full flexion extension.  Decreased pain in palpation.  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    He is very anxious to get some improvement.  Corticosteroid injection into the tendon sheath today under ultrasound guidance.  He tolerated procedure well.  No  complications.    We will also start him on some bicep flexion exercises using Thera-Band.  Follow-up 3 to 4 weeks.    Red flags were discussed and he will call us for any of those occur or if he has any new or worsening symptoms in the interim. See problem based charting & AVS for pt instructions. No problem-specific Assessment & Plan notes found for this encounter.

## 2022-06-07 ENCOUNTER — Encounter: Payer: Self-pay | Admitting: Family Medicine

## 2022-06-07 ENCOUNTER — Ambulatory Visit: Payer: Medicare Other | Admitting: Family Medicine

## 2022-06-07 VITALS — BP 122/76 | Ht 67.0 in | Wt 168.0 lb

## 2022-06-07 DIAGNOSIS — M7521 Bicipital tendinitis, right shoulder: Secondary | ICD-10-CM | POA: Diagnosis not present

## 2022-06-07 DIAGNOSIS — M67959 Unspecified disorder of synovium and tendon, unspecified thigh: Secondary | ICD-10-CM | POA: Diagnosis not present

## 2022-06-07 NOTE — Assessment & Plan Note (Signed)
Mild. HEP and stretches given. F/u if not improving.

## 2022-06-07 NOTE — Progress Notes (Signed)
  George Perry - 66 y.o. male MRN [id number]  Date of birth: 1955/09/08    SUBJECTIVE:      Chief Complaint:/ HPI:   1. F/u Left shoulder / bicep tendon pain. S/p CSI tendon sheath  at last office visit. Much better. Gradual improvement over 2 weeks then 3-5 days of increased pain which was followed by a "pop" sensation while doing his exercises. After that, pain was once again mostly  gone. Still has 5-10 pain down in the muscle belley but way better, 2. Also having pain now in right hip, mostly when he gets up from  sitting for a long time, esp if sitting on hard surface. Once he walks a few steps it resolves.Does not bother hm while running.    OBJECTIVE: BP 122/76   Ht '5\' 7"'$  (1.702 m)   Wt 168 lb (76.2 kg)   BMI 26.31 kg/m   Physical Exam:  Vital signs are reviewed. GEN WD WN NAD Left shoulder FROM all planes rotator cuff. Bicep testing does not reveal any pain or weakness. Palpation of bicep tendon non tender HIPS: right hip crossed leg at ankle over knee on right side reproduces pain exactly and he has difficulty attaining position. Non tender to palpation around area of greater trochanter. IR/Er of hip joint (isolated) is full and painless/  ASSESSMENT & PLAN:  See problem based charting & AVS for pt instructions. Biceps tendinitis of right upper extremity Essentially resolved. I suspect he had small adhesion that caused the 'pop" sensation. F/u PRN   Tendinopathy of gluteus medius Mild. HEP and stretches given. F/u if not improving.

## 2022-06-07 NOTE — Assessment & Plan Note (Signed)
Essentially resolved. I suspect he had small adhesion that caused the 'pop" sensation. F/u PRN

## 2022-08-02 ENCOUNTER — Encounter: Payer: Self-pay | Admitting: Internal Medicine

## 2022-08-21 ENCOUNTER — Ambulatory Visit (AMBULATORY_SURGERY_CENTER): Payer: Medicare Other | Admitting: *Deleted

## 2022-08-21 VITALS — Ht 67.0 in | Wt 168.0 lb

## 2022-08-21 DIAGNOSIS — Z86010 Personal history of colonic polyps: Secondary | ICD-10-CM

## 2022-08-21 MED ORDER — NA SULFATE-K SULFATE-MG SULF 17.5-3.13-1.6 GM/177ML PO SOLN: 1.0000 | Freq: Once | ORAL | 0 refills | Status: AC

## 2022-08-21 NOTE — Progress Notes (Addendum)
No egg or soy allergy known to patient  No issues known to pt with past sedation with any surgeries or procedures Patient denies having been intubated No issues moving neck or head No swallowing issues No FH of Malignant Hyperthermia Pt is not on diet pills Pt is not on  home 02  Pt is not on blood thinners  Pt denies issues with constipation  Pt is not on dialysis Pt denies any upcoming cardiac testing Pt encouraged to use to use Singlecare or Goodrx to reduce cost Patient's chart reviewed by George Perry CNRA prior to previsit and patient appropriate for the [place].  Previsit completed and red dot placed by patient's name on their procedure day (on provider's schedule).  . Visit by phone Instructions reviewed with pt and pt states understanding. Instructed to review again prior to procedure. Pt states they will.  Instructions sent by mail  with coupon and by my chart (instructions only)

## 2022-09-06 ENCOUNTER — Encounter: Payer: Self-pay | Admitting: Internal Medicine

## 2022-09-19 ENCOUNTER — Ambulatory Visit (AMBULATORY_SURGERY_CENTER): Payer: Medicare Other | Admitting: Internal Medicine

## 2022-09-19 ENCOUNTER — Encounter: Payer: Self-pay | Admitting: Internal Medicine

## 2022-09-19 VITALS — BP 104/72 | HR 61 | Temp 98.0°F | Resp 11 | Ht 67.0 in | Wt 168.0 lb

## 2022-09-19 DIAGNOSIS — Z09 Encounter for follow-up examination after completed treatment for conditions other than malignant neoplasm: Secondary | ICD-10-CM | POA: Diagnosis present

## 2022-09-19 DIAGNOSIS — Z86010 Personal history of colonic polyps: Secondary | ICD-10-CM

## 2022-09-19 DIAGNOSIS — K635 Polyp of colon: Secondary | ICD-10-CM | POA: Diagnosis not present

## 2022-09-19 DIAGNOSIS — D120 Benign neoplasm of cecum: Secondary | ICD-10-CM | POA: Diagnosis not present

## 2022-09-19 MED ORDER — SODIUM CHLORIDE 0.9 % IV SOLN: 500.0000 mL | INTRAVENOUS | Status: DC

## 2022-09-19 NOTE — Progress Notes (Signed)
Stable to recovery, report given to RN

## 2022-09-19 NOTE — Patient Instructions (Addendum)
Recommendation- Patient has a contact number available for                            emergencies. The signs and symptoms of potential                            delayed complications were discussed with the                            patient. Return to normal activities tomorrow.                            Written discharge instructions were provided to the                            patient.                           - Resume previous diet.                           - Continue present medications.                           - Await pathology results.                           - Repeat colonoscopy is recommended for                            surveillance. The colonoscopy date will be                            determined after pathology results from today's                            exam become available for review.  Handouts on hemorrhoids and polyps given.  YOU HAD AN ENDOSCOPIC PROCEDURE TODAY AT [facility]:   Refer to the procedure report that was given to you for any specific questions about what was found during the examination.  If the procedure report does not answer your questions, please call your gastroenterologist to clarify.  If you requested that your care partner not be given the details of your procedure findings, then the procedure report has been included in a sealed envelope for you to review at your convenience later.  YOU SHOULD EXPECT: Some feelings of bloating in the abdomen. Passage of more gas than usual.  Walking can help get rid of the air that was put into your GI tract during the procedure and reduce the bloating. If you had a lower endoscopy (such as a colonoscopy or flexible sigmoidoscopy) you may notice spotting of blood in your stool or on the toilet paper. If you underwent a bowel prep for your procedure, you may not have a normal bowel movement for a few days.  Please Note:  You might notice some irritation and congestion in your nose or some  drainage.  This is from the oxygen used during your procedure.  There is no need for concern and it should clear up in a day or so.  SYMPTOMS TO REPORT IMMEDIATELY:  Following lower endoscopy (colonoscopy or flexible sigmoidoscopy):  Excessive amounts of blood in the stool  Significant tenderness or worsening of abdominal pains  Swelling of the abdomen that is new, acute  Fever of 100F or higher  For urgent or emergent issues, a gastroenterologist can be reached at any hour by calling [phone]. Do not use MyChart messaging for urgent concerns.   DIET:  We do recommend a small meal at first, but then you may proceed to your regular diet.  Drink plenty of fluids but you should avoid alcoholic beverages for 24 hours.  ACTIVITY:  You should plan to take it easy for the rest of today and you should NOT DRIVE or use heavy machinery until tomorrow (because of the sedation medicines used during the test).    FOLLOW UP: Our staff will call the number listed on your records the next business day following your procedure.  We will call around 7:15- 8:00 am to check on you and address any questions or concerns that you may have regarding the information given to you following your procedure. If we do not reach you, we will leave a message.     If any biopsies were taken you will be contacted by phone or by letter within the next 1-3 weeks.  Please call us at [phone] if you have not heard about the biopsies in 3 weeks.   SIGNATURES/CONFIDENTIALITY: You and/or your care partner have signed paperwork which will be entered into your electronic medical record.  These signatures attest to the fact that that the information above on your After Visit Summary has been reviewed and is understood.  Full responsibility of the confidentiality of this discharge information lies with you and/or your care-partner.

## 2022-09-19 NOTE — Progress Notes (Signed)
Pt's states no medical or surgical changes since previsit or office visit. 

## 2022-09-19 NOTE — Progress Notes (Signed)
GASTROENTEROLOGY PROCEDURE H&P NOTE   Primary Care Physician: Lorene Dy, MD    Reason for Procedure:  History of adenomatous colon polyps  Plan:    Colonoscopy  Patient is appropriate for endoscopic procedure(s) in the ambulatory ([facility]) setting.  The nature of the procedure, as well as the risks, benefits, and alternatives were carefully and thoroughly reviewed with the patient. Ample time for discussion and questions allowed. The patient understood, was satisfied, and agreed to proceed.     HPI: George Perry is a 67 y.o. male who presents for colonoscopy.  Medical history as below.  Tolerated the prep.  No recent chest pain or shortness of breath.  No abdominal pain today.  Past Medical History:  Diagnosis Date   Carotid artery dissection ([place]) 10/23/2012   Colon polyp    Insomnia    Tremor    right pinky    Past Surgical History:  Procedure Laterality Date   COLONOSCOPY  2002,2005   HERNIA REPAIR     Bil    Prior to Admission medications   Medication Sig Start Date End Date Taking? Authorizing Provider  Calcium Carbonate-Vit D-Min (CALCIUM 1200 PO) Take by mouth as needed.   Yes [provider]  Ascorbic Acid (VITAMIN C) 1000 MG tablet Take 1,000 mg by mouth daily.    [provider]  b complex vitamins tablet Take 1 tablet by mouth daily. Patient not taking: Reported on 08/21/2022    [provider]  MAGNESIUM PO Take 1 tablet by mouth daily.    [provider]  Multiple Vitamins-Minerals (ZINC PO) Take 1 tablet by mouth daily. Patient not taking: Reported on 08/21/2022    [provider]  vitamin E 100 UNIT capsule Take 100 Units by mouth daily.    [provider]  zinc gluconate 50 MG tablet Take 50 mg by mouth daily.    [provider]    Current Outpatient Medications  Medication Sig Dispense Refill   Calcium Carbonate-Vit D-Min (CALCIUM 1200 PO) Take by mouth as needed.     Ascorbic  Acid (VITAMIN C) 1000 MG tablet Take 1,000 mg by mouth daily.     b complex vitamins tablet Take 1 tablet by mouth daily. (Patient not taking: Reported on 08/21/2022)     MAGNESIUM PO Take 1 tablet by mouth daily.     Multiple Vitamins-Minerals (ZINC PO) Take 1 tablet by mouth daily. (Patient not taking: Reported on 08/21/2022)     vitamin E 100 UNIT capsule Take 100 Units by mouth daily.     zinc gluconate 50 MG tablet Take 50 mg by mouth daily.     Current Facility-Administered Medications  Medication Dose Route Frequency Provider Last Rate Last Admin   0.9 %  sodium chloride infusion  500 mL Intravenous Continuous Breeann Reposa, Lajuan Lines, MD        Allergies as of 09/19/2022 - Review Complete 09/19/2022  Allergen Reaction Noted   Penicillins Nausea And Vomiting 10/23/2012   Oxycodone Nausea And Vomiting 08/12/2017   Penicillin g Nausea And Vomiting 08/21/2022    Family History  Problem Relation Age of Onset   Healthy Mother    Coronary artery disease Father    Heart disease Father    Breast cancer Sister    Tracheal cancer Brother    Esophageal cancer Brother    Colon cancer Neg Hx    Liver disease Neg Hx    Pancreatic cancer Neg Hx    Prostate cancer  Neg Hx    Rectal cancer Neg Hx    Stomach cancer Neg Hx    Colon polyps Neg Hx     Social History   Socioeconomic History   Marital status: Married    Spouse name: Rose   Number of children: 4   Years of education: masters4   Highest education level: Not on file  Occupational History   Occupation: Occupational hygienist: [employer]  Tobacco Use   Smoking status: Never   Smokeless tobacco: Never  Vaping Use   Vaping Use: Never used  Substance and Sexual Activity   Alcohol use: Yes    Comment: occasional   Drug use: No   Sexual activity: Not on file  Other Topics Concern   Not on file  Social History Narrative   Lives at home with wife.   Left-handed.   One cup caffeine daily.    Social Determinants of Health    Financial Resource Strain: Not on file  Food Insecurity: Not on file  Transportation Needs: Not on file  Physical Activity: Not on file  Stress: Not on file  Social Connections: Not on file  Intimate Partner Violence: Not on file    Physical Exam: Vital signs in last 24 hours: '@BP'$  132/86   Pulse 80   Temp 98 F (36.7 C)   Ht '5\' 7"'$  (1.702 m)   Wt 168 lb (76.2 kg)   SpO2 98%   BMI 26.31 kg/m  GEN: NAD EYE: Sclerae anicteric ENT: MMM CV: Non-tachycardic Pulm: CTA b/l GI: Soft, NT/ND NEURO:  Alert & Oriented x 3   Zenovia Jarred, MD [facility]  09/19/2022 8:40 AM

## 2022-09-19 NOTE — Op Note (Signed)
[facility] Patient Name: George Perry Procedure Date: 09/19/2022 8:41 AM MRN: [id number] Endoscopist: Jerene Bears , MD, [id number] Age: 67 Referring MD:  Date of Birth: January 24, 1956 Gender: Male Account #: [id number] Procedure:                Colonoscopy Indications:              High risk colon cancer surveillance: Personal                            history of non-advanced adenoma, Last colonoscopy:                            January 2019 Medicines:                Monitored Anesthesia Care Procedure:                Pre-Anesthesia Assessment:                           - Prior to the procedure, a History and Physical                            was performed, and patient medications and                            allergies were reviewed. The patient's tolerance of                            previous anesthesia was also reviewed. The risks                            and benefits of the procedure and the sedation                            options and risks were discussed with the patient.                            All questions were answered, and informed consent                            was obtained. Prior Anticoagulants: The patient has                            taken no anticoagulant or antiplatelet agents. ASA                            Grade Assessment: II - A patient with mild systemic                            disease. After reviewing the risks and benefits,                            the patient was deemed in satisfactory condition to  undergo the procedure.                           After obtaining informed consent, the colonoscope                            was passed under direct vision. Throughout the                            procedure, the patient's blood pressure, pulse, and                            oxygen saturations were monitored continuously. The                            CF [id number] was introduced through the  anus                            and advanced to the cecum, identified by                            appendiceal orifice and ileocecal valve. The                            colonoscopy was performed without difficulty. The                            patient tolerated the procedure well. The quality                            of the bowel preparation was good. The ileocecal                            valve, appendiceal orifice, and rectum were                            photographed. Scope In: 8:57:33 AM Scope Out: 9:13:50 AM Scope Withdrawal Time: 0 hours 10 minutes 50 seconds  Total Procedure Duration: 0 hours 16 minutes 17 seconds  Findings:                 The digital rectal exam was normal.                           A 5 mm polyp was found in the ileocecal valve. The                            polyp was sessile. The polyp was removed with a                            cold snare. Resection and retrieval were complete.                           Internal hemorrhoids were found during  retroflexion. The hemorrhoids were small.                           The exam was otherwise without abnormality. Complications:            No immediate complications. Estimated Blood Loss:     Estimated blood loss: none. Impression:               - One 5 mm polyp at the ileocecal valve, removed                            with a cold snare. Resected and retrieved.                           - Small internal hemorrhoids.                           - The examination was otherwise normal. Recommendation:           - Patient has a contact number available for                            emergencies. The signs and symptoms of potential                            delayed complications were discussed with the                            patient. Return to normal activities tomorrow.                            Written discharge instructions were provided to the                             patient.                           - Resume previous diet.                           - Continue present medications.                           - Await pathology results.                           - Repeat colonoscopy is recommended for                            surveillance. The colonoscopy date will be                            determined after pathology results from today's                            exam become available for review. Jerene Bears, MD 09/19/2022 9:15:57 AM This report has been signed electronically.

## 2022-09-20 ENCOUNTER — Telehealth: Payer: Self-pay

## 2022-09-20 NOTE — Telephone Encounter (Signed)
Left message on follow up call. 

## 2022-09-23 ENCOUNTER — Encounter: Payer: Self-pay | Admitting: Internal Medicine

## 2022-10-18 ENCOUNTER — Other Ambulatory Visit: Payer: Self-pay

## 2022-10-18 ENCOUNTER — Other Ambulatory Visit: Payer: Self-pay | Admitting: Internal Medicine

## 2022-10-18 DIAGNOSIS — M810 Age-related osteoporosis without current pathological fracture: Secondary | ICD-10-CM

## 2023-01-13 ENCOUNTER — Ambulatory Visit: Payer: Medicare Other | Admitting: Neurology

## 2023-01-13 ENCOUNTER — Encounter: Payer: Self-pay | Admitting: Neurology

## 2023-01-13 VITALS — BP 119/76 | HR 72 | Ht 67.0 in | Wt 167.5 lb

## 2023-01-13 DIAGNOSIS — R251 Tremor, unspecified: Secondary | ICD-10-CM

## 2023-01-13 DIAGNOSIS — I7771 Dissection of carotid artery: Secondary | ICD-10-CM

## 2023-01-13 NOTE — Progress Notes (Signed)
Chief Complaint  Patient presents with   Follow-up    Rm 14. Patient with wife Clotilde Dieter, complains of hand tremor being slightly worse than last visit but not by much. Dizzy spells with feelings of tunnel vision, three times in a day last week which was much more than usual. Patient reports episodes of fast heart rate while sitting, he reports he does run but this is higher than when he runs and wife will check BP and it is low. Patient also reported he received a cortisone shot two months ago and now top part of thigh is numb.       ASSESSMENT AND PLAN  George Perry is a 67 y.o. male   Intermittent dizziness, tunnel vision, reported episode of tachycardia without clear triggers,  Suggesting cardiology evaluation to rule out cardiac arrhythmia, presyncope  Intermittent right hand  tremor,  Normal TSH in the past,  No parkinsonian features,  Mild action component, continue observe his symptoms,  History of left internal carotid artery dissection  Ultrasound of carotid artery   DIAGNOSTIC DATA (LABS, IMAGING, TESTING) - I reviewed patient records, labs, notes, testing and imaging myself where available.   MEDICAL HISTORY:  George Perry, is companied by his wife Clotilde Dieter, seen in request by his primary care physician Dr. Burton Apley for evaluation of dizziness, headache, hand tremor,   I reviewed and summarized the referring note. History of left internal carotid artery dissection in 2014,  He has a history of left internal carotid artery dissection in January 2014, presented with nausea vomiting, headache, centered around his left retro-orbital region,  MRI of the brain was normal, MRA of the brain and neck demonstrate left internal carotid artery dissection extending to the level of vertical petrous intracranial ICA, luminal narrowing up to 60%,  His headache last for few days, had persistent left droopy eyelid, left facial flush since the accident  In 2016, repeat  MRA of the brain and neck, MRI of the brain was normal, he was no longer on antiplatelet agent treatments  He reports long history of intermittent right hand tremor, he is left hand dominant, no interruption in his daily activity, no family history of tremor, no loss sense of smell, occasionally acting out of dreams, he still exercise regularly, running 2 miles without difficulty,  Since beginning of 2024, he has intermittent episode of sudden onset dizziness, lasting for 30 minutes, often associated with headaches, but no focal neurological signs, it can happen in standing or sitting position, he did report intermittent episode of tachycardia with heart rate up to 140 with no clear trigger.  PHYSICAL EXAM:   Vitals:   01/13/23 1250  BP: 119/76  Pulse: 72  Weight: 167 lb 8 oz (76 kg)  Height: 5\' 7"  (1.702 m)   Sitting down: 122/29, HR 72,  Standing up 121/89, HR 79    Body mass index is 26.23 kg/m.  PHYSICAL EXAMNIATION:  Gen: NAD, conversant, well nourised, well groomed                     Cardiovascular: Regular rate rhythm, no peripheral edema, warm, nontender. Eyes: Conjunctivae clear without exudates or hemorrhage Neck: Supple, no carotid bruits. Pulmonary: Clear to auscultation bilaterally   NEUROLOGICAL EXAM:  MENTAL STATUS: Speech/cognition: Awake, alert, oriented to history taking and casual conversation CRANIAL NERVES: CN II: Visual fields are full to confrontation. Pupils are round equal and briskly reactive to light. CN III, IV, VI: extraocular movement are  normal. No ptosis. CN V: Facial sensation is intact to light touch CN VII: Face is symmetric with normal eye closure  CN VIII: Hearing is normal to causal conversation. CN IX, X: Phonation is normal. CN XI: Head turning and shoulder shrug are intact  MOTOR: Muscle bulk and tone are normal. Muscle strength is normal.  No rigidity no bradykinesia, mild intermittent right hand tremor  REFLEXES: Reflexes are  2+ and symmetric at the biceps, triceps, knees, and ankles. Plantar responses are flexor.  SENSORY: Intact to light touch, pinprick and vibratory sensation are intact in fingers and toes.  COORDINATION: There is no trunk or limb dysmetria noted.  GAIT/STANCE: Posture is normal. Gait is steady with normal steps, base, arm swing, and turning. Heel and toe walking are normal. Tandem gait is normal.  Romberg is absent.  REVIEW OF SYSTEMS:  Full 14 system review of systems performed and notable only for as above All other review of systems were negative.   ALLERGIES: Allergies  Allergen Reactions   Penicillins Nausea And Vomiting   Oxycodone Nausea And Vomiting   Penicillin G Nausea And Vomiting    HOME MEDICATIONS: Current Outpatient Medications  Medication Sig Dispense Refill   Ascorbic Acid (VITAMIN C) 1000 MG tablet Take 1,000 mg by mouth daily.     b complex vitamins tablet Take 1 tablet by mouth daily.     Calcium Carbonate-Vit D-Min (CALCIUM 1200 PO) Take by mouth as needed.     MAGNESIUM PO Take 1 tablet by mouth daily.     Multiple Vitamins-Minerals (ZINC PO) Take 1 tablet by mouth daily.     vitamin E 100 UNIT capsule Take 100 Units by mouth daily.     zinc gluconate 50 MG tablet Take 50 mg by mouth daily.     No current facility-administered medications for this visit.    PAST MEDICAL HISTORY: Past Medical History:  Diagnosis Date   Carotid artery dissection (HCC) 10/23/2012   Colon polyp    Insomnia    Tremor    right pinky    PAST SURGICAL HISTORY: Past Surgical History:  Procedure Laterality Date   COLONOSCOPY  2002,2005   HERNIA REPAIR     Bil    FAMILY HISTORY: Family History  Problem Relation Age of Onset   Healthy Mother    Coronary artery disease Father    Heart disease Father    Breast cancer Sister    Tracheal cancer Brother    Esophageal cancer Brother    Colon cancer Neg Hx    Liver disease Neg Hx    Pancreatic cancer Neg Hx     Prostate cancer Neg Hx    Rectal cancer Neg Hx    Stomach cancer Neg Hx    Colon polyps Neg Hx     SOCIAL HISTORY: Social History   Socioeconomic History   Marital status: Married    Spouse name: Rose   Number of children: 4   Years of education: masters4   Highest education level: Not on file  Occupational History   Occupation: Pharmacologist: [employer]  Tobacco Use   Smoking status: Never   Smokeless tobacco: Never  Vaping Use   Vaping Use: Never used  Substance and Sexual Activity   Alcohol use: Yes    Comment: occasional   Drug use: No   Sexual activity: Not on file  Other Topics Concern   Not on file  Social History Narrative   Lives at  home with wife.   Left-handed.   One cup caffeine daily.    Social Determinants of Health   Financial Resource Strain: Not on file  Food Insecurity: Not on file  Transportation Needs: Not on file  Physical Activity: Not on file  Stress: Not on file  Social Connections: Not on file  Intimate Partner Violence: Not on file      Levert Feinstein, M.D. Ph.D.  [facility] [address] [place], [place] [address] Ph: [phone] Fax: [phone]  CC:  Burton Apley, MD [address] [place],  [place] [address]  Burton Apley, MD

## 2023-02-05 ENCOUNTER — Telehealth: Payer: Self-pay | Admitting: Cardiology

## 2023-02-05 NOTE — Telephone Encounter (Signed)
Call to patient, straight to VM.  LM to call office

## 2023-02-05 NOTE — Telephone Encounter (Signed)
This patient is asking about an earlier appt with Dr Swaziland, returning call to "[facility]", I did not see an earlier appt available. Sent message to Floria Raveling  She said that the appt he has scheduled is the earliest available appt with Dr Swaziland  Called the patient back and told him that there are not any earlier appts. He verbalized understanding.

## 2023-02-05 NOTE — Telephone Encounter (Signed)
Patient is returning call. Please advise? 

## 2023-02-05 NOTE — Telephone Encounter (Signed)
Patient called back to speak with Carollee Herter and said that she had called to set up a NP appt with him. Told patient I'm not aware of who that was but was able to get him an Oct appt. Said that she had something sooner for him so I told him I would leave a message for her if she had another slot for him

## 2023-02-10 ENCOUNTER — Ambulatory Visit: Payer: PRIVATE HEALTH INSURANCE | Admitting: Neurology

## 2023-02-10 ENCOUNTER — Ambulatory Visit (HOSPITAL_COMMUNITY)
Admission: RE | Admit: 2023-02-10 | Discharge: 2023-02-10 | Disposition: A | Discharge status: Home / Self Care | Payer: Medicare Other | Source: Ambulatory Visit | Attending: Neurology | Admitting: Neurology

## 2023-02-10 DIAGNOSIS — R251 Tremor, unspecified: Secondary | ICD-10-CM | POA: Insufficient documentation

## 2023-02-10 DIAGNOSIS — I7771 Dissection of carotid artery: Secondary | ICD-10-CM | POA: Insufficient documentation

## 2023-02-18 NOTE — Progress Notes (Signed)
Carotid US shows his healed carotid dissection on the left, with no significant blockage of the carotid arteries

## 2023-04-24 ENCOUNTER — Ambulatory Visit: Payer: Medicare Other | Attending: Cardiology | Admitting: Cardiology

## 2023-04-24 ENCOUNTER — Encounter: Payer: Self-pay | Admitting: Cardiology

## 2023-04-24 VITALS — BP 108/80 | HR 72 | Ht 67.5 in | Wt 168.8 lb

## 2023-04-24 DIAGNOSIS — R002 Palpitations: Secondary | ICD-10-CM | POA: Diagnosis not present

## 2023-04-24 NOTE — Patient Instructions (Signed)
Medication Instructions:  Your physician recommends that you continue on your current medications as directed. Please refer to the Current Medication list given to you today.  *If you need a refill on your cardiac medications before your next appointment, please call your pharmacy*  Lab Work: -None ordered  Testing/Procedures: -None ordered  Follow-Up: At [facility], you and your health needs are our priority.  As part of our continuing mission to provide you with exceptional heart care, we have created designated Provider Care Teams.  These Care Teams include your primary Cardiologist (physician) and Advanced Practice Providers (APPs -  Physician Assistants and Nurse Practitioners) who all work together to provide you with the care you need, when you need it.  Your next appointment:   Follow-up as needed   Provider:   Peter Swaziland, MD     Other Instructions -Try to capture racing heart on Apple watch and send screenshots via MyChart

## 2023-04-24 NOTE — Progress Notes (Signed)
Cardiology Office Note:    Date:  04/24/2023   ID:  Ulyess Mort, DOB May 28, 1956, MRN [id number]  PCP:  Burton Apley, MD   [facility] Providers Cardiologist:  Marianna Cid Swaziland, MD     Referring MD: Burton Apley, MD   Chief Complaint  Patient presents with   Palpitations    History of Present Illness:    George Perry is a 67 y.o. male is seen at the request of Dr Su Hilt for evaluation of tachyarrhythmia. He is in excellent health. He reports sporadic episodes of tachycardia where his HR will suddenly increase to 150 bpm. This will also terminate suddenly. No dizziness, syncope, chest pain. Typically last 5-10 minutes but had one episode lasting 30-45 minutes. He is active running 2-3 times a week and HR will get up to 130-140 running. These spells happen less than once a month. No clear triggers.   Past Medical History:  Diagnosis Date   Carotid artery dissection (HCC) 10/23/2012   Colon polyp    Insomnia    Tremor    right pinky    Past Surgical History:  Procedure Laterality Date   COLONOSCOPY  2002,2005   HERNIA REPAIR     Bil    Current Medications: Current Meds  Medication Sig   Ascorbic Acid (VITAMIN C) 1000 MG tablet Take 1,000 mg by mouth daily.   b complex vitamins tablet Take 1 tablet by mouth daily.   Calcium Carbonate-Vit D-Min (CALCIUM 1200 PO) Take by mouth as needed.   MAGNESIUM PO Take 1 tablet by mouth daily.   Multiple Vitamins-Minerals (ZINC PO) Take 1 tablet by mouth daily.   vitamin E 100 UNIT capsule Take 100 Units by mouth daily.   zinc gluconate 50 MG tablet Take 50 mg by mouth daily.     Allergies:   Penicillins, Oxycodone, and Penicillin g   Social History   Socioeconomic History   Marital status: Married    Spouse name: Rose   Number of children: 4   Years of education: masters4   Highest education level: Not on file  Occupational History   Occupation: Pharmacologist: OTHER  Tobacco Use    Smoking status: Never   Smokeless tobacco: Never  Vaping Use   Vaping status: Never Used  Substance and Sexual Activity   Alcohol use: Yes    Comment: occasional   Drug use: No   Sexual activity: Not on file  Other Topics Concern   Not on file  Social History Narrative   Lives at home with wife.   Left-handed.   One cup caffeine daily.    Social Determinants of Health   Financial Resource Strain: Not on file  Food Insecurity: Not on file  Transportation Needs: Not on file  Physical Activity: Not on file  Stress: Not on file  Social Connections: Not on file     Family History: The patient's family history includes Breast cancer in his sister; Coronary artery disease in his father; Esophageal cancer in his brother; Healthy in his mother; Heart attack (age of onset: 67) in his father; Heart disease in his father; Tracheal cancer in his brother. There is no history of Colon cancer, Liver disease, Pancreatic cancer, Prostate cancer, Rectal cancer, Stomach cancer, or Colon polyps.  ROS:   Please see the history of present illness.     All other systems reviewed and are negative.  EKGs/Labs/Other Studies Reviewed:    The following studies were reviewed  today: Ecg dated 12/13/22: NSR with occ PVC. Otherwise normal. I have personally reviewed and interpreted this study.       Recent Labs: No results found for requested labs within last 365 days.  Recent Lipid Panel No results found for: "CHOL", "TRIG", "HDL", "CHOLHDL", "VLDL", "LDLCALC", "LDLDIRECT"  Dated 12/13/22: cholesterol 180, triglycerides 116, HDL 48, LDL 110. A1c 5.2%. CMET, CBC, TSH and PSA normal  Risk Assessment/Calculations:                Physical Exam:    VS:  BP 108/80 (BP Location: Left Arm, Patient Position: Sitting, Cuff Size: Normal)   Pulse 72   Ht 5' 7.5" (1.715 m)   Wt 168 lb 12.8 oz (76.6 kg)   SpO2 99%   BMI 26.05 kg/m     Wt Readings from Last 3 Encounters:  04/24/23 168 lb 12.8 oz  (76.6 kg)  01/13/23 167 lb 8 oz (76 kg)  09/19/22 168 lb (76.2 kg)     GEN:  Well nourished, well developed in no acute distress HEENT: Normal NECK: No JVD; No carotid bruits LYMPHATICS: No lymphadenopathy CARDIAC: RRR, no murmurs, rubs, gallops RESPIRATORY:  Clear to auscultation without rales, wheezing or rhonchi  ABDOMEN: Soft, non-tender, non-distended MUSCULOSKELETAL:  No edema; No deformity  SKIN: Warm and dry NEUROLOGIC:  Alert and oriented x 3 PSYCHIATRIC:  Normal affect   ASSESSMENT:    1. Palpitations    PLAN:    In order of problems listed above:  Patient has intermittent tachycardia with no clear triggers. Sudden onset and termination c/w reentry arrhythmia. Symptoms are infrequent and sporadic. Otherwise excellent health. Patient has an Apple watch with ability to capture rhythm strip. This is new. I asked that he try and record when he has an episode and we can review. Otherwise no further testing at this time.           Medication Adjustments/Labs and Tests Ordered: Current medicines are reviewed at length with the patient today.  Concerns regarding medicines are outlined above.  No orders of the defined types were placed in this encounter.  No orders of the defined types were placed in this encounter.   Patient Instructions  Medication Instructions:  Your physician recommends that you continue on your current medications as directed. Please refer to the Current Medication list given to you today.  *If you need a refill on your cardiac medications before your next appointment, please call your pharmacy*  Lab Work: -None ordered  Testing/Procedures: -None ordered  Follow-Up: At [facility], you and your health needs are our priority.  As part of our continuing mission to provide you with exceptional heart care, we have created designated Provider Care Teams.  These Care Teams include your primary Cardiologist (physician) and Advanced Practice  Providers (APPs -  Physician Assistants and Nurse Practitioners) who all work together to provide you with the care you need, when you need it.  Your next appointment:   Follow-up as needed   Provider:   Sireen Halk Swaziland, MD     Other Instructions -Try to capture racing heart on Apple watch and send screenshots via MyChart    Signed, Marquette Piontek Swaziland, MD  04/24/2023 3:54 PM    [facility]

## 2023-04-28 ENCOUNTER — Ambulatory Visit
Admission: RE | Admit: 2023-04-28 | Discharge: 2023-04-28 | Disposition: A | Discharge status: Home / Self Care | Payer: Medicare Other | Source: Ambulatory Visit | Attending: Internal Medicine | Admitting: Internal Medicine

## 2023-04-28 DIAGNOSIS — M810 Age-related osteoporosis without current pathological fracture: Secondary | ICD-10-CM

## 2023-05-15 ENCOUNTER — Ambulatory Visit: Payer: Medicare Other | Admitting: Cardiology

## 2023-05-23 ENCOUNTER — Encounter: Payer: Self-pay | Admitting: Cardiology

## 2023-05-23 NOTE — Telephone Encounter (Signed)
Called patient left message on personal voice mail Dr.Jordan is out of office today.I will call back after Dr.Jordan reviews apple watch EKG.

## 2023-05-26 ENCOUNTER — Other Ambulatory Visit: Payer: Self-pay

## 2023-05-26 DIAGNOSIS — R002 Palpitations: Secondary | ICD-10-CM

## 2023-05-27 ENCOUNTER — Ambulatory Visit: Payer: Medicare Other | Admitting: Cardiology

## 2023-06-30 DIAGNOSIS — R002 Palpitations: Secondary | ICD-10-CM

## 2023-07-17 ENCOUNTER — Telehealth: Payer: Self-pay | Admitting: Neurology

## 2023-07-22 NOTE — Telephone Encounter (Signed)
Error

## 2023-07-30 ENCOUNTER — Ambulatory Visit: Payer: Medicare Other | Attending: Cardiology

## 2023-07-30 DIAGNOSIS — R002 Palpitations: Secondary | ICD-10-CM

## 2023-07-31 ENCOUNTER — Encounter: Payer: Self-pay | Admitting: Cardiology

## 2023-07-31 NOTE — Telephone Encounter (Signed)
 Called patient left message on personal voice mail to call back.

## 2023-08-07 ENCOUNTER — Encounter: Payer: Self-pay | Admitting: Neurology

## 2023-08-11 NOTE — Telephone Encounter (Signed)
He was seen by Dr. Delena Bali before, I have never seen him, it is ok for him to see Dr. Teresa Coombs.

## 2023-08-12 NOTE — Telephone Encounter (Signed)
Yes, I am okay with the transfer. Thanks

## 2023-08-13 ENCOUNTER — Telehealth: Payer: Self-pay

## 2023-08-13 NOTE — Telephone Encounter (Signed)
Call to patient, NP appointment scheduled with Dr. Teresa Coombs.

## 2023-09-10 ENCOUNTER — Encounter: Payer: Self-pay | Admitting: Cardiology

## 2023-09-12 NOTE — Telephone Encounter (Signed)
 Spoke to patient Dr.Jordan's advice given.

## 2023-10-15 ENCOUNTER — Ambulatory Visit: Payer: Medicare Other | Admitting: Neurology

## 2023-10-15 ENCOUNTER — Encounter: Payer: Self-pay | Admitting: Neurology

## 2023-10-15 VITALS — BP 124/74 | Ht 66.0 in | Wt 160.0 lb

## 2023-10-15 DIAGNOSIS — R251 Tremor, unspecified: Secondary | ICD-10-CM | POA: Diagnosis not present

## 2023-10-15 NOTE — Progress Notes (Signed)
 [facility]  PATIENT: George Perry DOB: 10-11-1955  REQUESTING CLINICIAN: Burton Apley, MD HISTORY FROM: Patient  REASON FOR VISIT: Tremors    HISTORICAL  CHIEF COMPLAINT:  Chief Complaint  Patient presents with   New Patient (Initial Visit)    Pt in 12, here with wife [name]  Pt is here for tremors.     HISTORY OF PRESENT ILLNESS:  This is a 68 year old gentleman with history of carotid dissection 2014, now healed who is presenting with complaints of tremor for the past 3 years.  Patient tells me the tremor is intermittent, started 3 years ago, and he usually notices it when extending his arm.  Denies any tremor at rest, denies any difficulty with writing, difficulty with fine movements.  Denies any family history of tremors.  Again the tremor does not affect his day-to-day activity. No other complaints.  He reports a history of carotid dissection in 2014, now healed, had subsequent MRA which showed a healed left carotid dissection.     OTHER MEDICAL CONDITIONS: History of carotid dissection   REVIEW OF SYSTEMS: Full 14 system review of systems performed and negative with exception of: As noted in the HPI   ALLERGIES: Allergies  Allergen Reactions   Penicillins Nausea And Vomiting   Oxycodone Nausea And Vomiting   Penicillin G Nausea And Vomiting    HOME MEDICATIONS: Outpatient Medications Prior to Visit  Medication Sig Dispense Refill   Ascorbic Acid (VITAMIN C) 1000 MG tablet Take 1,000 mg by mouth daily.     b complex vitamins tablet Take 1 tablet by mouth daily.     Calcium Carbonate-Vit D-Min (CALCIUM 1200 PO) Take by mouth as needed.     MAGNESIUM PO Take 1 tablet by mouth daily.     Multiple Vitamins-Minerals (ZINC PO) Take 1 tablet by mouth daily.     vitamin E 100 UNIT capsule Take 100 Units by mouth daily.     zinc gluconate 50 MG tablet Take 50 mg by mouth daily.     No facility-administered medications prior to visit.     PAST MEDICAL HISTORY: Past Medical History:  Diagnosis Date   Carotid artery dissection (HCC) 10/23/2012   Colon polyp    Insomnia    Tremor    right pinky    PAST SURGICAL HISTORY: Past Surgical History:  Procedure Laterality Date   COLONOSCOPY  2002,2005   HERNIA REPAIR     Bil    FAMILY HISTORY: Family History  Problem Relation Age of Onset   Healthy Mother    Heart attack Father 32   Coronary artery disease Father    Heart disease Father    Breast cancer Sister    Tracheal cancer Brother    Esophageal cancer Brother    Colon cancer Neg Hx    Liver disease Neg Hx    Pancreatic cancer Neg Hx    Prostate cancer Neg Hx    Rectal cancer Neg Hx    Stomach cancer Neg Hx    Colon polyps Neg Hx     SOCIAL HISTORY: Social History   Socioeconomic History   Marital status: Married    Spouse name: Rose   Number of children: 4   Years of education: masters4   Highest education level: Not on file  Occupational History   Occupation: Pharmacologist: OTHER  Tobacco Use   Smoking status: Never   Smokeless tobacco: Never  Vaping Use   Vaping status:  Never Used  Substance and Sexual Activity   Alcohol use: Yes    Alcohol/week: 1.0 standard drink of alcohol    Types: 1 Glasses of wine per week    Comment: occasional   Drug use: No   Sexual activity: Not on file  Other Topics Concern   Not on file  Social History Narrative   Lives at home with wife.   Left-handed.   One cup caffeine daily.    Social Drivers of Corporate investment banker Strain: Not on file  Food Insecurity: Not on file  Transportation Needs: Not on file  Physical Activity: Not on file  Stress: Not on file  Social Connections: Not on file  Intimate Partner Violence: Not on file    PHYSICAL EXAM  GENERAL EXAM/CONSTITUTIONAL: Vitals:  Vitals:   10/15/23 0811  BP: 124/74  Weight: 160 lb (72.6 kg)  Height: 5\' 6"  (1.676 m)   Body mass index is 25.82 kg/m. Wt  Readings from Last 3 Encounters:  10/15/23 160 lb (72.6 kg)  04/24/23 168 lb 12.8 oz (76.6 kg)  01/13/23 167 lb 8 oz (76 kg)   Patient is in no distress; well developed, nourished and groomed; neck is supple  MUSCULOSKELETAL: Gait, strength, tone, movements noted in Neurologic exam below  NEUROLOGIC: MENTAL STATUS:      No data to display         awake, alert, oriented to person, place and time recent and remote memory intact normal attention and concentration language fluent, comprehension intact, naming intact fund of knowledge appropriate  CRANIAL NERVE:  2nd, 3rd, 4th, 6th - Visual fields full to confrontation, extraocular muscles intact, no nystagmus 5th - facial sensation symmetric 7th - facial strength symmetric. There is a slight left eyelid drooping 8th - hearing intact 9th - palate elevates symmetrically, uvula midline 11th - shoulder shrug symmetric 12th - tongue protrusion midline  MOTOR:  normal bulk and tone, full strength in the BUE, BLE  SENSORY:  normal and symmetric to light touch  COORDINATION:  finger-nose-finger, fine finger movements normal. There is a very mild action tremors, no resting tremors noted. Normal Archimedes spiral  GAIT/STATION:  normal   DIAGNOSTIC DATA (LABS, IMAGING, TESTING) - I reviewed patient records, labs, notes, testing and imaging myself where available.  Lab Results  Component Value Date   WBC 6.0 01/04/2022   HGB 16.6 01/04/2022   HCT 46.7 01/04/2022   MCV 95.9 01/04/2022   PLT 136 (L) 01/04/2022      Component Value Date/Time   NA 137 01/04/2022 1113   K 4.0 01/04/2022 1113   CL 107 01/04/2022 1113   CO2 25 01/04/2022 1113   GLUCOSE 99 01/04/2022 1113   BUN 17 01/04/2022 1113   CREATININE 0.98 01/04/2022 1113   CALCIUM 9.0 01/04/2022 1113   GFRNONAA >60 01/04/2022 1113   GFRAA >60 10/18/2017 1645   No results found for: "CHOL", "HDL", "LDLCALC", "LDLDIRECT", "TRIG", "CHOLHDL" No results found for:  "HGBA1C" No results found for: "VITAMINB12" Lab Results  Component Value Date   TSH 1.787 01/04/2022     ASSESSMENT AND PLAN  68 y.o. year old male with history of carotid dissection who is presenting with complaints of tremor for the past 3 years, intermittent, this is action tremor.  On exam, tremor is very mild and does not affect the patient.  Normal Archimedes spirals. Will continue to observe him, and he will contact us if determined to get worse.   1. Tremor  Patient Instructions  Continue current medications  Continue to follow up with PCP  Return if worse  No orders of the defined types were placed in this encounter.   No orders of the defined types were placed in this encounter.   Return if symptoms worsen or fail to improve.    Windell Norfolk, MD 10/15/2023, 8:36 AM  [facility] [address], [address] [place], [place] [address] [phone]

## 2023-10-15 NOTE — Patient Instructions (Signed)
 Continue current medications  Continue to follow up with PCP  Return if worse

## 2023-11-17 ENCOUNTER — Other Ambulatory Visit: Payer: Self-pay | Admitting: Medical Genetics

## 2023-11-26 ENCOUNTER — Encounter: Payer: Self-pay | Admitting: Cardiology

## 2023-11-26 NOTE — Telephone Encounter (Signed)
 Please review and advise.

## 2024-01-13 ENCOUNTER — Other Ambulatory Visit: Payer: Self-pay | Admitting: Medical Genetics

## 2024-01-13 DIAGNOSIS — Z006 Encounter for examination for normal comparison and control in clinical research program: Secondary | ICD-10-CM

## 2024-01-16 ENCOUNTER — Encounter: Payer: Self-pay | Admitting: Cardiology

## 2024-01-23 LAB — GENECONNECT MOLECULAR SCREEN: Genetic Analysis Overall Interpretation: NEGATIVE

## 2024-01-31 IMAGING — CT CT ABD-PELV W/ CM
1 of 3 series · 13 of 32 positions shown, 18 images · IV contrast (APPLIED)
Comparison: CT April 22, 2008 axial

CLINICAL DATA: Right lower abdominal/pelvic pain, improving.

EXAM:
CT ABDOMEN AND PELVIS WITH CONTRAST
TECHNIQUE: Multidetector CT imaging of the abdomen and pelvis was performed
using the standard protocol following bolus administration of
intravenous contrast.

[Series 2: abd/pelvis w/cm · axial · 0.82mm/px · z∈[-470,-75]mm · 13 of 93 slices shown, 18 images]
[im 7/93  soft-tissue]
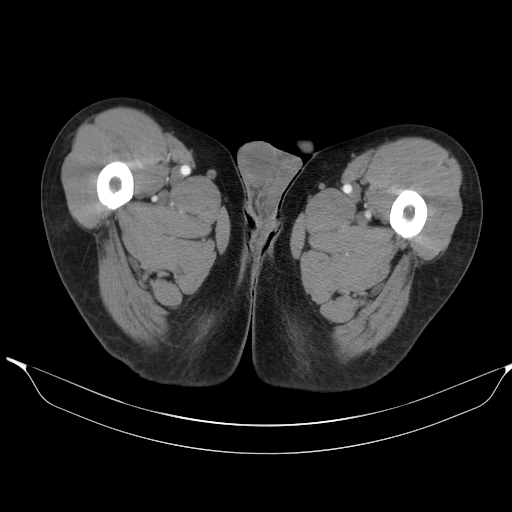
[im 7/93  bone]
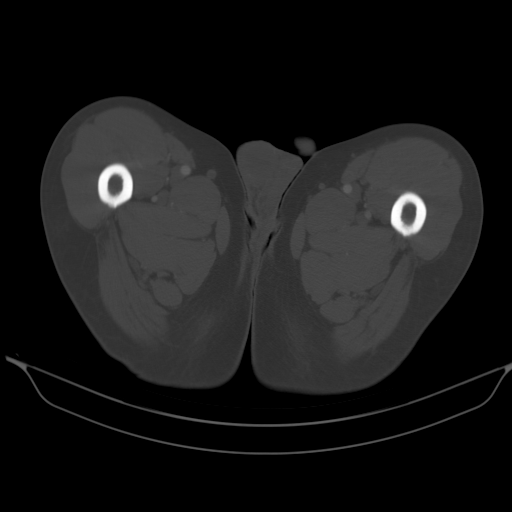
[im 13/93  soft-tissue]
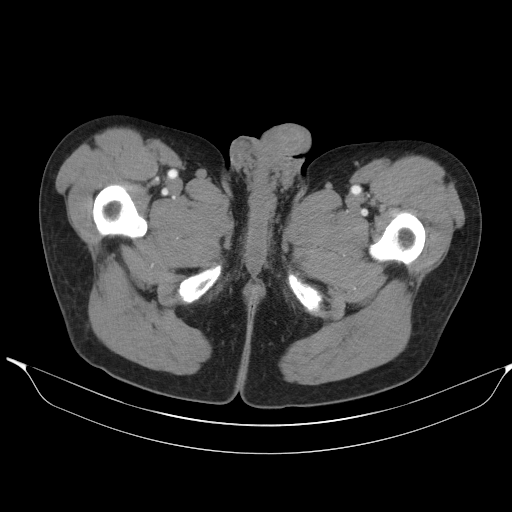
[im 19/93  soft-tissue]
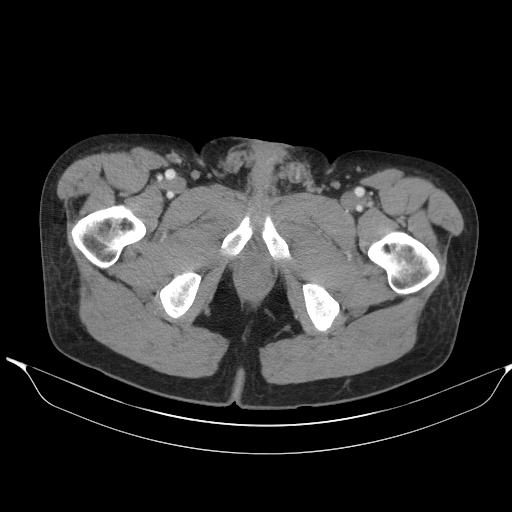
[im 31/93  soft-tissue]
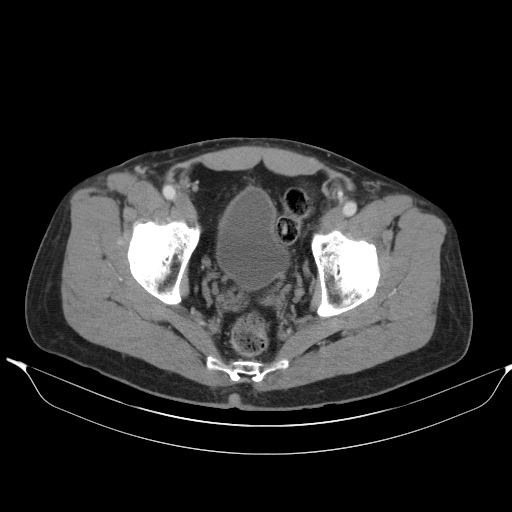
[im 37/93  soft-tissue]
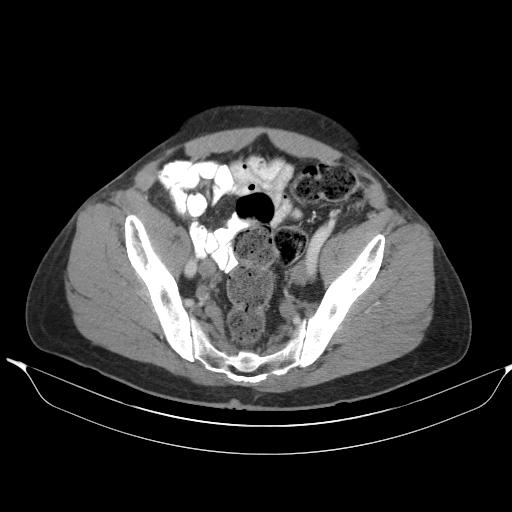
[im 43/93  soft-tissue]
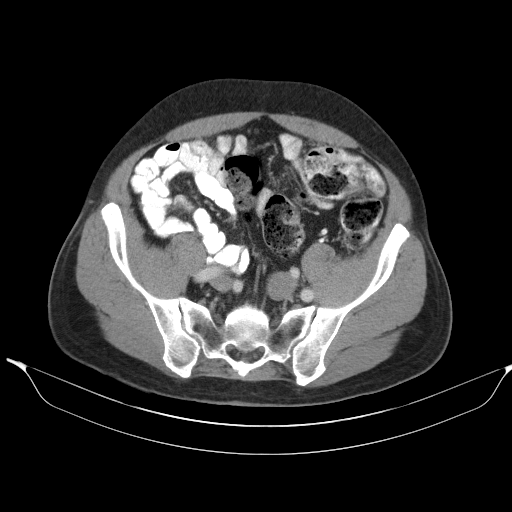
[im 50/93  soft-tissue]
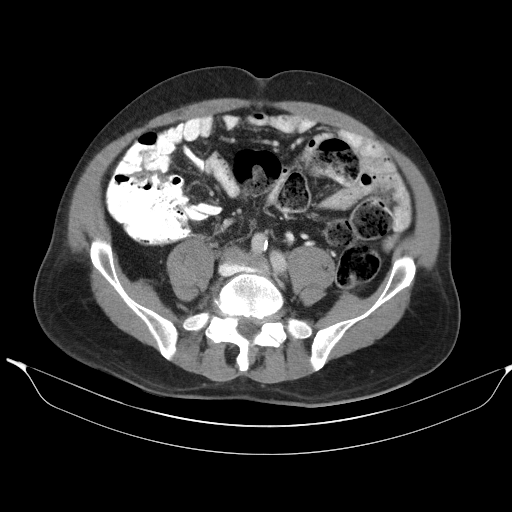
[im 56/93  soft-tissue]
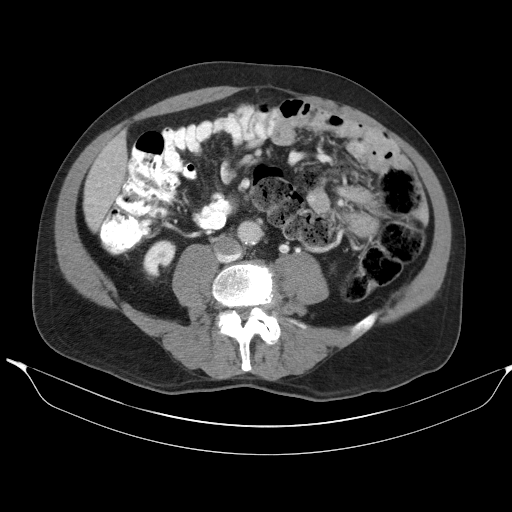
[im 62/93  soft-tissue]
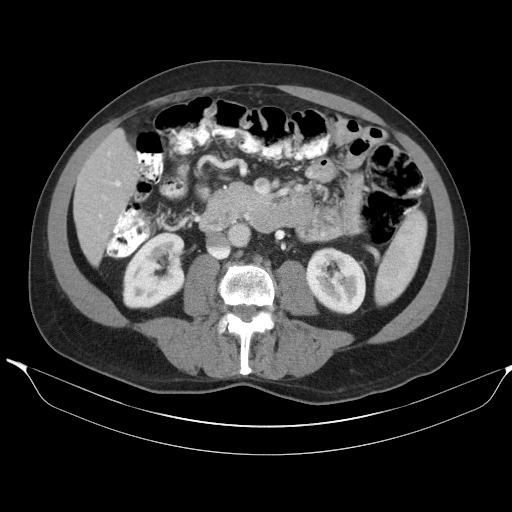
[im 62/93  bone]
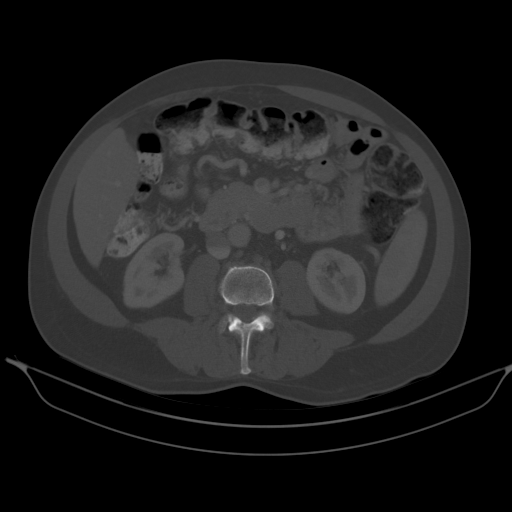
[im 68/93  lung]
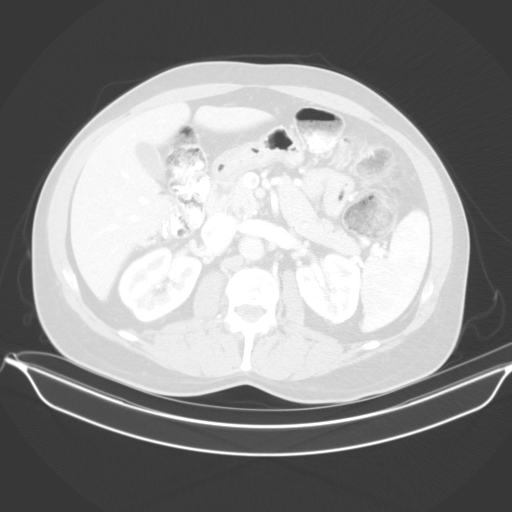
[im 74/93  soft-tissue]
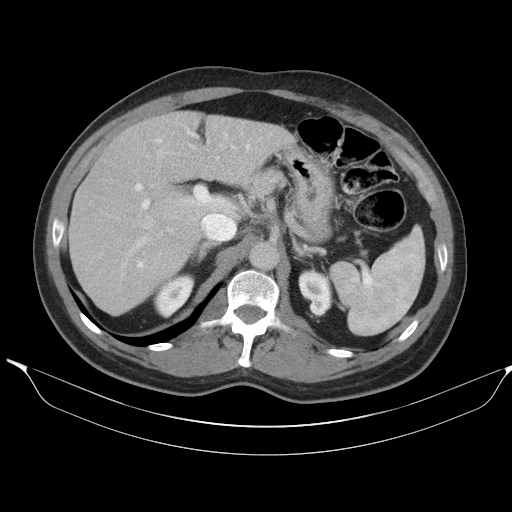
[im 74/93  lung]
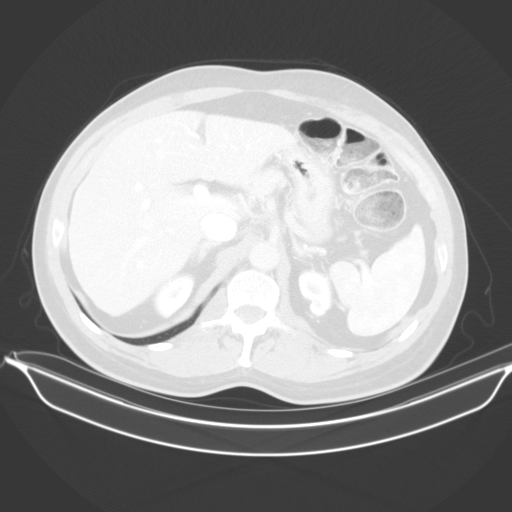
[im 80/93  soft-tissue]
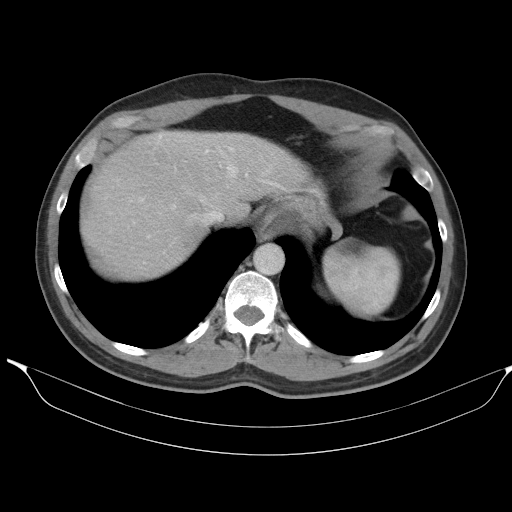
[im 80/93  lung]
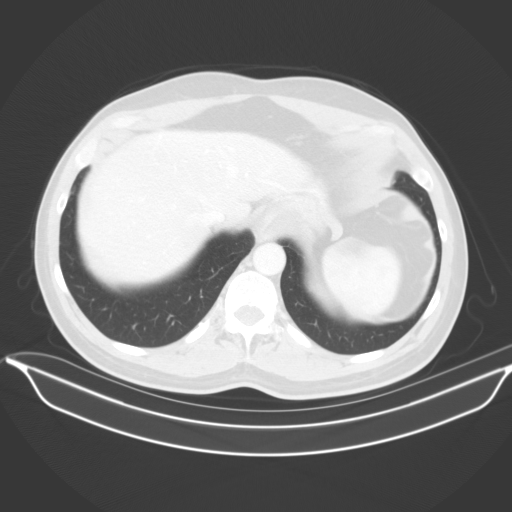
[im 86/93  soft-tissue]
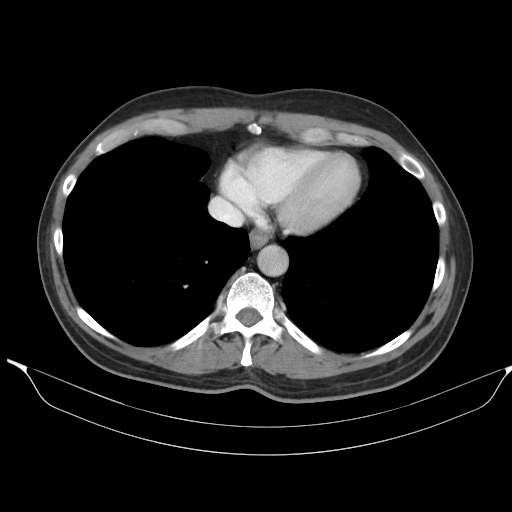
[im 86/93  lung]
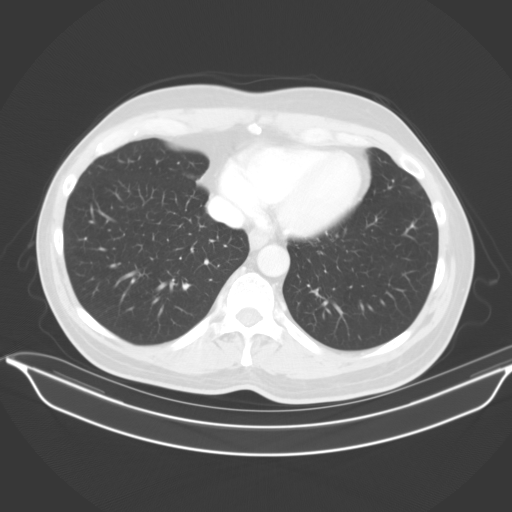

[13 of 32 positions shown; findings below may reference images not displayed]

RADIATION DOSE REDUCTION: This exam was performed according to the
departmental dose-optimization program which includes automated
exposure control, adjustment of the mA and/or kV according to
patient size and/or use of iterative reconstruction technique.

CONTRAST:  100mL BLA2ZA-6VV IOPAMIDOL (BLA2ZA-6VV) INJECTION 61%
FINDINGS: Lower chest: No acute abnormality.

Hepatobiliary: No suspicious hepatic lesion. Gallbladder is
unremarkable. No biliary ductal dilation.

Pancreas: No pancreatic ductal dilation or evidence of acute
inflammation.

Spleen: No splenomegaly or focal splenic lesion.

Adrenals/Urinary Tract: Bilateral adrenal glands appear normal. No
hydronephrosis. Symmetric enhancement excretion of contrast. No
solid enhancing renal mass. Urinary bladder is unremarkable for
degree of distension.

Stomach/Bowel: Radiopaque enteric contrast material traverses the
splenic flexure. Small hiatal hernia. There is mild low-density
gastric wall thickening which may reflect underdistention or
gastritis. No pathologic dilation of small or large bowel. Terminal
ileum appears normal. The appendix is not confidently identified
however there is no pericecal inflammation. Moderate to large volume
of formed stool throughout the colon. No evidence of acute bowel
inflammation.

Vascular/Lymphatic: Aortic atherosclerosis without aneurysmal
dilation. No pathologically enlarged abdominal or pelvic lymph
nodes.

Reproductive: Enlarged prostate gland.

Other: No significant abdominopelvic free fluid.

Musculoskeletal: Multilevel degenerative changes spine. No acute
osseous abnormality.
IMPRESSION: 1. Mild low-density gastric wall thickening which may reflect
underdistention or gastritis.
2. Moderate to large volume of formed stool throughout the colon
suggesting constipation.
3. Enlarged prostate gland.
4.  Aortic Atherosclerosis ([id number]).

## 2024-05-10 ENCOUNTER — Encounter: Payer: Self-pay | Admitting: Cardiology

## 2024-05-11 ENCOUNTER — Ambulatory Visit: Attending: Cardiology

## 2024-05-11 ENCOUNTER — Other Ambulatory Visit: Payer: Self-pay

## 2024-05-11 DIAGNOSIS — R002 Palpitations: Secondary | ICD-10-CM

## 2024-05-11 NOTE — Telephone Encounter (Signed)
 Spoke to patient Dr.Jordan's advice given.Advised monitor will be mailed to your home with instructions.

## 2024-05-11 NOTE — Progress Notes (Unsigned)
 Enrolled patient for a 14 day Zio XT  monitor to be mailed to patients home

## 2024-06-08 ENCOUNTER — Ambulatory Visit: Payer: Self-pay | Admitting: Cardiology

## 2024-06-08 DIAGNOSIS — R002 Palpitations: Secondary | ICD-10-CM | POA: Diagnosis not present

## 2024-07-07 ENCOUNTER — Encounter: Payer: Self-pay | Admitting: Cardiology

## 2024-07-07 NOTE — Progress Notes (Unsigned)
 Cardiology Office Note:    Date:  07/08/2024   ID:  George Perry, DOB 03-Apr-1956, MRN [id number]  PCP:  Henry Ingle, MD   [facility] Providers Cardiologist:  Soraida Vickers, MD     Referring MD: Henry Ingle, MD   Chief Complaint  Patient presents with   Palpitations    History of Present Illness:    George Perry is a 68 y.o. male is seen for follow up of tachyarrhythmia. He is in excellent health. He reports sporadic episodes of tachycardia where his HR will suddenly increase to 150 bpm. This will also terminate suddenly. No dizziness, syncope, chest pain. Typically last 5-10 minutes but had one episode lasting 30-45 minutes. He is active running 2-3 times a week and HR will get up to 130-140 running. These spells happen less than once a month. No clear triggers.   He noted some increase in palpitations this fall and an event monitor was placed showing nonsustained runs of SVT that correlated with symptoms. He also sent in rhythm strips from his phone showing SVT rate 143 bpm.   He still reports these episodes are sporadic. He did have an episode in [place] in late November that lasted 3.5 hours and he did not feel well with that. His wife thinks stress is a trigger.   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   MAGNESIUM  PO Take 1 tablet by mouth daily.   metoprolol  succinate (TOPROL  XL) 25 MG 24 hr tablet Take 1 tablet (25 mg total) by mouth daily.   Multiple Vitamins-Minerals (ZINC PO) Take 1 tablet by mouth daily.   vitamin E 100 UNIT capsule Take 100 Units by mouth daily.   zinc gluconate 50 MG tablet Take 50 mg by mouth daily.      Allergies:   Penicillins, Oxycodone , and Penicillin g   Social History   Socioeconomic History   Marital status: Married    Spouse name: Rose   Number of children: 4   Years of education: masters4   Highest education level: Not on file  Occupational History   Occupation: Pharmacologist: [employer]  Tobacco Use   Smoking status: Never   Smokeless tobacco: Never  Vaping Use   Vaping status: Never Used  Substance and Sexual Activity   Alcohol use: Yes    Alcohol/week: 1.0 standard drink of alcohol    Types: 1 Glasses of wine per week    Comment: occasional   Drug use: No   Sexual activity: Not on file  Other Topics Concern   Not on file  Social History Narrative   Lives at home with wife.   Left-handed.   One cup caffeine daily.    Social Drivers of Health   Tobacco Use: Low Risk (07/08/2024)   Patient History    Smoking Tobacco Use: Never    Smokeless Tobacco Use: Never    Passive Exposure: Not on file  Financial Resource Strain: Not on file  Food Insecurity: Not on file  Transportation Needs: Not on file  Physical Activity: Not on file  Stress: Not on file  Social Connections: Not on file  Depression (EYV7-0): Not on file  Alcohol Screen: Not on file  Housing: Not on file  Utilities: Not on file  Health Literacy: Not on file     Family History: The patient's family history includes Breast cancer in his sister; Coronary artery disease in his father; Esophageal cancer in his brother; Healthy in his mother; Heart attack (age of onset: 42) in his father; Heart disease in his father; Tracheal cancer in his brother. There is no history of Colon cancer, Liver disease, Pancreatic cancer, Prostate cancer, Rectal cancer, Stomach cancer, or Colon polyps.  ROS:   Please see the history of present illness.     All other systems reviewed and are negative.  EKGs/Labs/Other Studies Reviewed:    The following studies were reviewed today:  EKG  Interpretation Date/Time:  Thursday July 08 2024 15:34:05 EST Ventricular Rate:  74 PR Interval:  198 QRS Duration:  88 QT Interval:  348 QTC Calculation: 386 R Axis:   -15  Text Interpretation: Normal sinus rhythm Normal ECG When compared with ECG of 04-Jan-2022 10:53,  No significant change was found  Confirmed by Seydou Hearns [phone]) on 07/08/2024 3:35:02 PM   Event monitor 06/08/24: Study Highlights Show Result Comparison    Normal sinus rhythm   26 runs of SVT longest lasting 12 minutes 28 seconds rate of 114- ? junctional tachycardia- no discernable P waves   Otherwise rare PVCs and PACs.   Symptoms correlate with SVT     Patch Wear Time:  13 days and 10 hours (2025-10-28T20:47:39-0400 to 2025-11-11T07:19:37-0500)   Patient had a min HR of 53 bpm, max HR of 143 bpm, and avg HR of 71 bpm. Predominant underlying rhythm was Sinus Rhythm. First Degree AV Block was present. 26 Supraventricular Tachycardia runs occurred, the run with the fastest interval lasting 1 min 31  secs with a max rate of 143 bpm, the longest lasting 12 mins 28 secs with an avg rate of 114 bpm. Supraventricular Tachycardia was detected within +/- 45 seconds of symptomatic patient event(s). Isolated SVEs were rare (<1.0%), SVE Couplets were rare  (<1.0%), and SVE Triplets were rare (<1.0%). Isolated VEs were occasional (1.4%, 18863), VE Couplets were rare (<1.0%, 36), and VE Triplets were rare (<1.0%, 3). Ventricular Bigeminy and Trigeminy were present.   EKG Interpretation Date/Time:  Thursday July 08 2024 15:34:05 EST Ventricular Rate:  74 PR Interval:  198 QRS Duration:  88 QT Interval:  348 QTC Calculation: 386 R Axis:   -15  Text Interpretation: Normal sinus rhythm Normal ECG When compared with ECG of 04-Jan-2022 10:53,  No significant change was found  Confirmed by Naiyana Barbian [phone]) on 07/08/2024 3:35:02 PM   Recent Labs: No results found for requested labs within last 365 days.   Recent Lipid Panel No results found for: CHOL, TRIG, HDL, CHOLHDL, VLDL, LDLCALC, LDLDIRECT  Dated 12/13/22: cholesterol 180, triglycerides 116, HDL 48, LDL 110. A1c 5.2%. CMET, CBC, TSH and PSA normal  Risk Assessment/Calculations:                Physical Exam:    VS:  BP 114/74 (BP Location: Left Arm, Patient Position: Sitting, Cuff Size: Normal)   Pulse 74   Ht 5' 7 (1.702 m)   Wt 174 lb 14.4 oz (79.3 kg)   SpO2 98%   BMI 27.39 kg/m     Wt Readings from Last 3 Encounters:  07/08/24 174 lb 14.4 oz (  79.3 kg)  10/15/23 160 lb (72.6 kg)  04/24/23 168 lb 12.8 oz (76.6 kg)     GEN:  Well nourished, well developed in no acute distress HEENT: Normal NECK: No JVD; No carotid bruits LYMPHATICS: No lymphadenopathy CARDIAC: RRR, no murmurs, rubs, gallops RESPIRATORY:  Clear to auscultation without rales, wheezing or rhonchi  ABDOMEN: Soft, non-tender, non-distended MUSCULOSKELETAL:  No edema; No deformity  SKIN: Warm and dry NEUROLOGIC:  Alert and oriented x 3 PSYCHIATRIC:  Normal affect   ASSESSMENT:    1. SVT (supraventricular tachycardia)   2. 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 Reviewed valsalva maneuver when he has an episode. Recommend starting Toprol  XL 25 mg daily. May take extra dose PRN. If arrhythmia doesn't break can go to ED for IV adenosine. Will check Echo. If he continues to have significant break through would consider ablation.  Follow up in 3 months.           Medication Adjustments/Labs and Tests Ordered: Current medicines are reviewed at length with the patient today.  Concerns regarding medicines are outlined above.  Orders Placed This Encounter  Procedures   EKG 12-Lead   Meds ordered this encounter  Medications   metoprolol  succinate (TOPROL  XL) 25 MG 24 hr tablet    Sig: Take 1 tablet (25 mg total) by mouth daily.    Dispense:  90 tablet     Refill:  3    Patient Instructions  Medication Instructions:   *If you need a refill on your cardiac medications before your next appointment, please call your pharmacy*  Lab Work:   Testing/Procedures:   Follow-Up: At [facility], you and your health needs are our priority.  As part of our continuing mission to provide you with exceptional heart care, our providers are all part of one team.  This team includes your primary Cardiologist (physician) and Advanced Practice Providers or APPs (Physician Assistants and Nurse Practitioners) who all work together to provide you with the care you need, when you need it.  Your next appointment:      Provider:     We recommend signing up for the patient portal called MyChart.  Sign up information is provided on this After Visit Summary.  MyChart is used to connect with patients for Virtual Visits (Telemedicine).  Patients are able to view lab/test results, encounter notes, upcoming appointments, etc.  Non-urgent messages can be sent to your provider as well.   To learn more about what you can do with MyChart, go to [email or web address].     Signed, Annarae Macnair, MD  07/08/2024 3:55 PM    [facility]

## 2024-07-08 ENCOUNTER — Encounter: Payer: Self-pay | Admitting: Cardiology

## 2024-07-08 ENCOUNTER — Ambulatory Visit: Attending: Cardiovascular Disease | Admitting: Cardiology

## 2024-07-08 VITALS — BP 114/74 | HR 74 | Ht 67.0 in | Wt 174.9 lb

## 2024-07-08 DIAGNOSIS — I7771 Dissection of carotid artery: Secondary | ICD-10-CM | POA: Diagnosis not present

## 2024-07-08 DIAGNOSIS — I471 Supraventricular tachycardia, unspecified: Secondary | ICD-10-CM | POA: Diagnosis not present

## 2024-07-08 MED ORDER — METOPROLOL SUCCINATE ER 25 MG PO TB24: 25.0000 mg | ORAL_TABLET | Freq: Every day | ORAL | 3 refills | Status: AC

## 2024-07-08 NOTE — Patient Instructions (Addendum)
 Medication Instructions:  Start Toprol  XL 25 mg daily Continue all other medications *If you need a refill on your cardiac medications before your next appointment, please call your pharmacy*  Lab Work: None ordered  Testing/Procedures: Echo   first available  Follow-Up: At [facility], you and your health needs are our priority.  As part of our continuing mission to provide you with exceptional heart care, our providers are all part of one team.  This team includes your primary Cardiologist (physician) and Advanced Practice Providers or APPs (Physician Assistants and Nurse Practitioners) who all work together to provide you with the care you need, when you need it.  Your next appointment:  3 months     Provider:  Dr.Jordan           Try Valsalva for fast heart beat    We recommend signing up for the patient portal called MyChart.  Sign up information is provided on this After Visit Summary.  MyChart is used to connect with patients for Virtual Visits (Telemedicine).  Patients are able to view lab/test results, encounter notes, upcoming appointments, etc.  Non-urgent messages can be sent to your provider as well.   To learn more about what you can do with MyChart, go to [email or web address].

## 2024-08-09 ENCOUNTER — Ambulatory Visit: Payer: Self-pay | Admitting: Cardiology

## 2024-08-09 ENCOUNTER — Ambulatory Visit (HOSPITAL_COMMUNITY)
Admission: RE | Admit: 2024-08-09 | Discharge: 2024-08-09 | Disposition: A | Discharge status: Home / Self Care | Source: Ambulatory Visit | Attending: Cardiology | Admitting: Cardiology

## 2024-08-09 DIAGNOSIS — E785 Hyperlipidemia, unspecified: Secondary | ICD-10-CM | POA: Diagnosis not present

## 2024-08-09 DIAGNOSIS — I471 Supraventricular tachycardia, unspecified: Secondary | ICD-10-CM | POA: Insufficient documentation

## 2024-08-09 DIAGNOSIS — R9431 Abnormal electrocardiogram [ECG] [EKG]: Secondary | ICD-10-CM | POA: Diagnosis not present

## 2024-08-09 LAB — ECHOCARDIOGRAM COMPLETE
AR max vel: 2.64 cm2
AV Area VTI: 2.7 cm2
AV Area mean vel: 2.53 cm2
AV Mean grad: 2.5 mmHg
AV Peak grad: 4.9 mmHg
Ao pk vel: 1.1 m/s
Area-P 1/2: 3.27 cm2
MV M vel: 0.91 m/s
MV Peak grad: 3.3 mmHg
S' Lateral: 2.17 cm

## 2024-09-29 ENCOUNTER — Ambulatory Visit: Admitting: Cardiology
